# Patient Record
Sex: Male | Born: 1935 | Race: White | Hispanic: No | Smoking: Former smoker
Health system: Southern US, Community
[De-identification: ages and names within clinical notes are randomized; demographics above are authoritative.]

## PROBLEM LIST (undated history)

## (undated) DIAGNOSIS — J449 Chronic obstructive pulmonary disease, unspecified: Secondary | ICD-10-CM

## (undated) DIAGNOSIS — N400 Enlarged prostate without lower urinary tract symptoms: Secondary | ICD-10-CM

## (undated) HISTORY — PX: NO PAST SURGERIES: SHX2092

---

## 2013-08-29 ENCOUNTER — Encounter (HOSPITAL_COMMUNITY): Payer: Self-pay | Admitting: Emergency Medicine

## 2013-08-29 ENCOUNTER — Emergency Department (HOSPITAL_COMMUNITY)
Admission: EM | Admit: 2013-08-29 | Discharge: 2013-08-29 | Disposition: A | Discharge status: Home / Self Care | Payer: Medicare Other | Attending: Emergency Medicine | Admitting: Emergency Medicine

## 2013-08-29 DIAGNOSIS — I776 Arteritis, unspecified: Secondary | ICD-10-CM | POA: Insufficient documentation

## 2013-08-29 DIAGNOSIS — J4489 Other specified chronic obstructive pulmonary disease: Secondary | ICD-10-CM | POA: Insufficient documentation

## 2013-08-29 DIAGNOSIS — Z87891 Personal history of nicotine dependence: Secondary | ICD-10-CM | POA: Insufficient documentation

## 2013-08-29 DIAGNOSIS — Z79899 Other long term (current) drug therapy: Secondary | ICD-10-CM | POA: Insufficient documentation

## 2013-08-29 DIAGNOSIS — J449 Chronic obstructive pulmonary disease, unspecified: Secondary | ICD-10-CM | POA: Insufficient documentation

## 2013-08-29 DIAGNOSIS — N400 Enlarged prostate without lower urinary tract symptoms: Secondary | ICD-10-CM | POA: Insufficient documentation

## 2013-08-29 DIAGNOSIS — R21 Rash and other nonspecific skin eruption: Secondary | ICD-10-CM | POA: Insufficient documentation

## 2013-08-29 HISTORY — DX: Benign prostatic hyperplasia without lower urinary tract symptoms: N40.0

## 2013-08-29 HISTORY — DX: Chronic obstructive pulmonary disease, unspecified: J44.9

## 2013-08-29 LAB — COMPREHENSIVE METABOLIC PANEL
ALK PHOS: 79 U/L (ref 39–117)
ALT: 24 U/L (ref 0–53)
AST: 25 U/L (ref 0–37)
Albumin: 3.5 g/dL (ref 3.5–5.2)
BILIRUBIN TOTAL: 0.4 mg/dL (ref 0.3–1.2)
BUN: 19 mg/dL (ref 6–23)
CHLORIDE: 100 meq/L (ref 96–112)
CO2: 26 mEq/L (ref 19–32)
Calcium: 9.2 mg/dL (ref 8.4–10.5)
Creatinine, Ser: 0.91 mg/dL (ref 0.50–1.35)
GFR calc non Af Amer: 80 mL/min — ABNORMAL LOW (ref >90)
GLUCOSE: 95 mg/dL (ref 70–99)
POTASSIUM: 4.3 meq/L (ref 3.7–5.3)
Sodium: 140 mEq/L (ref 137–147)
TOTAL PROTEIN: 6.3 g/dL (ref 6.0–8.3)

## 2013-08-29 LAB — CBC WITH DIFFERENTIAL/PLATELET
Basophils Absolute: 0.1 10*3/uL (ref 0.0–0.1)
Basophils Relative: 1 % (ref 0–1)
EOS ABS: 0.4 10*3/uL (ref 0.0–0.7)
Eosinophils Relative: 6 % — ABNORMAL HIGH (ref 0–5)
HEMATOCRIT: 38.6 % — AB (ref 39.0–52.0)
HEMOGLOBIN: 13.1 g/dL (ref 13.0–17.0)
Lymphocytes Relative: 23 % (ref 12–46)
Lymphs Abs: 1.6 10*3/uL (ref 0.7–4.0)
MCH: 31 pg (ref 26.0–34.0)
MCHC: 33.9 g/dL (ref 30.0–36.0)
MCV: 91.5 fL (ref 78.0–100.0)
MONO ABS: 0.5 10*3/uL (ref 0.1–1.0)
MONOS PCT: 7 % (ref 3–12)
Neutro Abs: 4.5 10*3/uL (ref 1.7–7.7)
Neutrophils Relative %: 63 % (ref 43–77)
Platelets: 182 10*3/uL (ref 150–400)
RBC: 4.22 MIL/uL (ref 4.22–5.81)
RDW: 13.3 % (ref 11.5–15.5)
WBC: 7 10*3/uL (ref 4.0–10.5)

## 2013-08-29 MED ORDER — HYDROXYZINE HCL 25 MG PO TABS: 25.0000 mg | ORAL_TABLET | Freq: Four times a day (QID) | ORAL | Status: DC | PRN

## 2013-08-29 MED ORDER — PREDNISONE 10 MG PO TABS: 20.0000 mg | ORAL_TABLET | Freq: Every day | ORAL | Status: DC

## 2013-08-29 NOTE — ED Provider Notes (Signed)
CSN: [id number]     Arrival date & time 08/29/13  [id number] History   First MD Initiated Contact with Patient 08/29/13 [phone]     Chief Complaint  Patient presents with  . Leg Swelling  . Rash     (Consider location/radiation/quality/duration/timing/severity/associated sxs/prior Treatment) Patient is a 80 y.o. male presenting with rash. The history is provided by the patient (the pt complains of a rash to his arms and legs.  he aslo has swelling to his left leg which he has seen cardiology about).  Rash Location: arms and legs. Quality: burning   Severity:  Moderate Onset quality:  Gradual Timing:  Constant Chronicity:  New Context: not animal contact   Relieved by:  Nothing Associated symptoms: no abdominal pain, no diarrhea, no fatigue and no headaches     Past Medical History  Diagnosis Date  . COPD (chronic obstructive pulmonary disease)   . Enlarged prostate    History reviewed. No pertinent past surgical history. No family history on file. History  Substance Use Topics  . Smoking status: Former Games developermoker  . Smokeless tobacco: Not on file  . Alcohol Use: Yes     Comment: 1 drink once a day    Review of Systems  Constitutional: Negative for appetite change and fatigue.  HENT: Negative for congestion, ear discharge and sinus pressure.   Eyes: Negative for discharge.  Respiratory: Negative for cough.   Cardiovascular: Negative for chest pain.  Gastrointestinal: Negative for abdominal pain and diarrhea.  Genitourinary: Negative for frequency and hematuria.  Musculoskeletal: Negative for back pain.  Skin: Positive for rash.  Neurological: Negative for seizures and headaches.  Psychiatric/Behavioral: Negative for hallucinations.      Allergies  Review of patient's allergies indicates no known allergies.  Home Medications   Prior to Admission medications   Medication Sig Start Date End Date Taking? Authorizing Provider  albuterol (PROVENTIL HFA;VENTOLIN HFA) 108 (90  BASE) MCG/ACT inhaler Inhale 2 puffs into the lungs every 6 (six) hours as needed for wheezing or shortness of breath.   Yes Historical Provider, MD  albuterol (PROVENTIL) (2.5 MG/3ML) 0.083% nebulizer solution Take 2.5 mg by nebulization 2 (two) times daily.   Yes Historical Provider, MD  finasteride (PROSCAR) 5 MG tablet Take 5 mg by mouth daily.   Yes Historical Provider, MD  Fluticasone-Salmeterol (ADVAIR) 250-50 MCG/DOSE AEPB Inhale 1 puff into the lungs daily.   Yes Historical Provider, MD  furosemide (LASIX) 40 MG tablet Take 40 mg by mouth daily.   Yes Historical Provider, MD  hydrOXYzine (ATARAX/VISTARIL) 25 MG tablet Take 1 tablet (25 mg total) by mouth every 6 (six) hours as needed for itching. 08/29/13   Benny LennertJoseph L Madia Carvell, MD  predniSONE (DELTASONE) 10 MG tablet Take 2 tablets (20 mg total) by mouth daily. 08/29/13   Benny LennertJoseph L Verita Kuroda, MD   BP 161/74  Pulse 81  Temp(Src) 98.4 F (36.9 C) (Oral)  Resp 18  SpO2 96% Physical Exam  Constitutional: He is oriented to person, place, and time. He appears well-developed.  HENT:  Head: Normocephalic.  Eyes: Conjunctivae and EOM are normal. No scleral icterus.  Neck: Neck supple. No thyromegaly present.  Cardiovascular: Normal rate and regular rhythm.  Exam reveals no gallop and no friction rub.   No murmur heard. Pulmonary/Chest: No stridor. He has no wheezes. He has no rales. He exhibits no tenderness.  Abdominal: He exhibits no distension. There is no tenderness. There is no rebound.  Musculoskeletal: Normal range of motion. He exhibits  edema.  Edema left lower leg.  2 plus  Lymphadenopathy:    He has no cervical adenopathy.  Neurological: He is oriented to person, place, and time. He exhibits normal muscle tone. Coordination normal.  Skin: Rash noted. No erythema.  Rash to both arms and legs.  Confluent.  Possibly allergic or vasculitis  Psychiatric: He has a normal mood and affect. His behavior is normal.    ED Course  Procedures  (including critical care time) Labs Review Labs Reviewed  CBC WITH DIFFERENTIAL - Abnormal; Notable for the following:    HCT 38.6 (*)    Eosinophils Relative 6 (*)    All other components within normal limits  COMPREHENSIVE METABOLIC PANEL - Abnormal; Notable for the following:    GFR calc non Af Amer 80 (*)    All other components within normal limits    Imaging Review No results found.   EKG Interpretation None      MDM   Final diagnoses:  Vasculitis    Will tx with prednisone and close follow up with pcp    Benny LennertJoseph L Elna Radovich, MD 08/29/13 1123

## 2013-08-29 NOTE — ED Notes (Signed)
Pt c/o bilateral lower leg swelling, told him it was d/t fluid retention, pt recently up his lasix dosage to 40 mg. Went to cardiologist and had a full work up with a heart echo and ekg, was told everything was fine. Pt also c/o rash to bilateral upper extremities, starting around elbows, reports it is very itchy. Tried some camomile lotion at home but didn't get much relief. Denies increased sob. Hx of copd. Nad, skin warm and dry, resp e/u.

## 2013-08-29 NOTE — Discharge Instructions (Signed)
Follow up with your family md in 2 days for recheck.  He may want to send you to a dermatologist

## 2013-08-29 NOTE — ED Notes (Signed)
Pt comfortable with discharge and follow up instructions. Prescriptions x2. 

## 2013-08-31 ENCOUNTER — Ambulatory Visit (INDEPENDENT_AMBULATORY_CARE_PROVIDER_SITE_OTHER)
Admission: RE | Admit: 2013-08-31 | Discharge: 2013-08-31 | Disposition: A | Discharge status: Home / Self Care | Payer: Medicare Other | Source: Ambulatory Visit | Attending: Pulmonary Disease | Admitting: Pulmonary Disease

## 2013-08-31 ENCOUNTER — Ambulatory Visit (INDEPENDENT_AMBULATORY_CARE_PROVIDER_SITE_OTHER): Payer: Medicare Other | Admitting: Pulmonary Disease

## 2013-08-31 ENCOUNTER — Encounter: Payer: Self-pay | Admitting: Pulmonary Disease

## 2013-08-31 VITALS — BP 132/64 | HR 74 | Temp 98.0°F | Ht 69.0 in | Wt 188.6 lb

## 2013-08-31 DIAGNOSIS — R0609 Other forms of dyspnea: Secondary | ICD-10-CM

## 2013-08-31 DIAGNOSIS — R0989 Other specified symptoms and signs involving the circulatory and respiratory systems: Secondary | ICD-10-CM

## 2013-08-31 DIAGNOSIS — J439 Emphysema, unspecified: Secondary | ICD-10-CM | POA: Insufficient documentation

## 2013-08-31 HISTORY — DX: Emphysema, unspecified: J43.9

## 2013-08-31 NOTE — Patient Instructions (Signed)
Stay on advair everyday Stay off cigarettes Will check a chest xray today, and call you with results. Will schedule for breathing tests in the next few weeks, and will see you back the same day for review.

## 2013-08-31 NOTE — Assessment & Plan Note (Signed)
The patient has significant dyspnea on exertion, but it is clearly better since he has stopped smoking. He also has very little cough, mucus, or congestion since he has stopped smoking. He tells me that he has a history of COPD from the distant past, but I wonder if this was simply related to airway inflammation from his smoking. He will need to have repeat pulmonary function studies to establish a diagnosis.  Will leave him on the Advair for now, but can make changes in his medications based on the results of his breathing studies. Will also check a chest x-ray as well. Regarding his lower extremity edema, I suspect that it is not related to anything from a pulmonary standpoint, but will complete his pulmonary workup first.

## 2013-08-31 NOTE — Progress Notes (Signed)
   Subjective:    Patient ID: Craig Rodriguez, male    DOB: 03/20/1934, 80 y.o.   MRN: [id number]  HPI The patient is a 80 year old male who I've been asked to see for COPD. The patient tells me that he was diagnosed many years ago, but he was a very heavy smoker at that time. He has not smoked since January of this year, and has seen a significant improvement in his pulmonary symptoms. The patient feels that he was stable until the first of the year when he had what sounds like a COPD exacerbation that required hospitalization. He has not smoked since that time. He currently denies cough or congestion, and states this has greatly improved since smoking cessation. He describes a one block dyspnea on exertion at a moderate pace, but does not get winded with a flight of stairs or bringing in groceries. He tells me that he could not do any of this prior to January when he was smoking. He currently is on Advair, and as well as prednisone for a new rash that has developed. He also has new swelling in his left lower extremity that is being evaluated. He tells me that he had negative venous Dopplers, and has also had a recent echocardiogram.   Review of Systems  Constitutional: Positive for unexpected weight change. Negative for fever.  HENT: Negative for congestion, dental problem, ear pain, nosebleeds, postnasal drip, rhinorrhea, sinus pressure, sneezing, sore throat and trouble swallowing.   Eyes: Negative for redness and itching.  Respiratory: Positive for cough and shortness of breath. Negative for chest tightness and wheezing.   Cardiovascular: Negative for palpitations and leg swelling.  Gastrointestinal: Negative for nausea and vomiting.  Genitourinary: Negative for dysuria.  Musculoskeletal: Positive for joint swelling.  Skin: Negative for rash ( itching).  Neurological: Negative for headaches.  Hematological: Does not bruise/bleed easily.  Psychiatric/Behavioral: Negative for dysphoric mood. The  patient is not nervous/anxious.        Objective:   Physical Exam Constitutional:  Well developed, no acute distress  HENT:  Nares patent without discharge  Oropharynx without exudate, palate and uvula are normal  Eyes:  Perrla, eomi, no scleral icterus  Neck:  No JVD, no TMG  Cardiovascular:  Normal rate, regular rhythm, no rubs or gallops.  No murmurs        Intact distal pulses but diminished.  Pulmonary :  Mildly decreased breath sounds, no stridor or respiratory distress   No rales, rhonchi, or wheezing  Abdominal:  Soft, nondistended, bowel sounds present.  No tenderness noted.   Musculoskeletal:  1+ lower extremity edema noted on LLE, mild on RLE (which is atrophied)  Lymph Nodes:  No cervical lymphadenopathy noted  Skin:  No cyanosis noted  Neurologic:  Alert, appropriate, moves all 4 extremities without obvious deficit.         Assessment & Plan:

## 2013-09-04 ENCOUNTER — Telehealth: Payer: Self-pay | Admitting: Pulmonary Disease

## 2013-09-04 ENCOUNTER — Other Ambulatory Visit: Payer: Self-pay | Admitting: Pulmonary Disease

## 2013-09-04 DIAGNOSIS — R9389 Abnormal findings on diagnostic imaging of other specified body structures: Secondary | ICD-10-CM

## 2013-09-04 HISTORY — DX: Abnormal findings on diagnostic imaging of other specified body structures: R93.89

## 2013-09-04 NOTE — Telephone Encounter (Signed)
Notes Recorded by Maisie FusAshtyn M Green, CMA on 09/04/2013 at 11:53 AM Results have been explained to patient, pt expressed understanding. Pt wishes to move forward with CT scan. Will need to schedule CT around his transportation. ------  Notes Recorded by Barbaraann ShareKeith M Clance, MD on 08/31/2013 at 5:39 PM Please let pt know that his cxr shows that part of his right lung is not getting air. This may be from mucus plugging the breathing passage, but can't exclude a growth. Needs ct chest to evaluate further. If he is ok with this, will send an order. ---  Called spoke with daughter made aware of results. CT is already scheduled. Nothing further needed

## 2013-09-05 ENCOUNTER — Institutional Professional Consult (permissible substitution): Payer: Medicare Other | Admitting: Internal Medicine

## 2013-09-10 ENCOUNTER — Other Ambulatory Visit: Payer: Medicare Other

## 2013-09-11 ENCOUNTER — Ambulatory Visit (INDEPENDENT_AMBULATORY_CARE_PROVIDER_SITE_OTHER)
Admission: RE | Admit: 2013-09-11 | Discharge: 2013-09-11 | Disposition: A | Discharge status: Home / Self Care | Payer: Medicare Other | Source: Ambulatory Visit | Attending: Pulmonary Disease | Admitting: Pulmonary Disease

## 2013-09-11 ENCOUNTER — Encounter: Payer: Self-pay | Admitting: Pulmonary Disease

## 2013-09-11 ENCOUNTER — Ambulatory Visit (HOSPITAL_COMMUNITY)
Admission: RE | Admit: 2013-09-11 | Discharge: 2013-09-11 | Disposition: A | Discharge status: Home / Self Care | Payer: Medicare Other | Source: Ambulatory Visit | Attending: Pulmonary Disease | Admitting: Pulmonary Disease

## 2013-09-11 ENCOUNTER — Ambulatory Visit (INDEPENDENT_AMBULATORY_CARE_PROVIDER_SITE_OTHER): Payer: Medicare Other | Admitting: Pulmonary Disease

## 2013-09-11 VITALS — BP 130/72 | HR 92 | Temp 98.4°F | Ht 69.0 in | Wt 183.6 lb

## 2013-09-11 DIAGNOSIS — R9389 Abnormal findings on diagnostic imaging of other specified body structures: Secondary | ICD-10-CM

## 2013-09-11 DIAGNOSIS — J439 Emphysema, unspecified: Secondary | ICD-10-CM

## 2013-09-11 DIAGNOSIS — R0989 Other specified symptoms and signs involving the circulatory and respiratory systems: Principal | ICD-10-CM | POA: Insufficient documentation

## 2013-09-11 DIAGNOSIS — R0609 Other forms of dyspnea: Secondary | ICD-10-CM | POA: Insufficient documentation

## 2013-09-11 DIAGNOSIS — J438 Other emphysema: Secondary | ICD-10-CM

## 2013-09-11 DIAGNOSIS — R918 Other nonspecific abnormal finding of lung field: Secondary | ICD-10-CM

## 2013-09-11 MED ORDER — ALBUTEROL SULFATE (2.5 MG/3ML) 0.083% IN NEBU
2.5000 mg | INHALATION_SOLUTION | Freq: Once | RESPIRATORY_TRACT | Status: AC
  Administered 2013-09-11: 2.5 mg via RESPIRATORY_TRACT

## 2013-09-11 NOTE — Patient Instructions (Signed)
Stay on advair, but let me know if you want to try adding spiriva or a medication that is different but in same family Stay as active as possible.  Let me know if you change your mind about referral to pulmonary [facility] Will check a chest xray at [facility] in one month, and will call you with results.  Let me know if you change your mind and want to look at the abnormal area within your lungs with a scope. followup with me in 6mos

## 2013-09-11 NOTE — Assessment & Plan Note (Addendum)
The patient has moderate to severe airflow obstruction by his PFTs, and most consistent with emphysema. He is on an LABA/ICS only, and has quit smoking. I have discussed adding an anticholinergic bronchodilator, however the patient does not want to do this because of his underlying prostate issues. I have also counseled him on aggressive weight loss and conditioning, and offered to refer to the pulmonary [facility]. He has decided against this as well. I have encouraged him to try and stay as active as possible, and to work on weight loss.  I have spent over 30 minutes discussing his abnormal CT finding, as well as his PFTs. I have counseled him on both of these at great length.

## 2013-09-11 NOTE — Progress Notes (Signed)
   Subjective:    Patient ID: Craig Rodriguez, male    DOB: 12/13/1933, 80 y.o.   MRN: [id number]  HPI The patient comes in today for followup of his recent PFTs and CT chest. His PFTs showed moderate to severe airflow obstruction, with a 22% improvement in his FEV1. He had air trapping on lung volumes, and a moderate to severely reduced DLCO. His chest x-ray at the last visit showed right middle lobe atelectasis, and a CT chest showed near complete collapse of his right middle lobe. There appeared to be significant narrowing of his right middle lobe bronchus, but it was unclear whether this was inflammatory or possibly neoplastic. I have reviewed all of this with the patient.   Review of Systems  Constitutional: Negative for fever and unexpected weight change.  HENT: Negative for congestion, dental problem, ear pain, nosebleeds, postnasal drip, rhinorrhea, sinus pressure, sneezing, sore throat and trouble swallowing.   Eyes: Negative for redness and itching.  Respiratory: Negative for cough, chest tightness, shortness of breath and wheezing.   Cardiovascular: Positive for leg swelling. Negative for palpitations.  Gastrointestinal: Negative for nausea and vomiting.  Genitourinary: Negative for dysuria.  Musculoskeletal: Negative for joint swelling.  Skin: Negative for rash.  Neurological: Negative for headaches.  Hematological: Does not bruise/bleed easily.  Psychiatric/Behavioral: Negative for dysphoric mood. The patient is not nervous/anxious.        Objective:   Physical Exam Overweight male in no acute distress Nose without purulence or discharge noted Neck without lymphadenopathy or thyromegaly Chest with decreased breath sounds, no wheezing Cardiac exam with regular rate and rhythm Lower extremities with mild ankle edema Alert and oriented, moves all 4 extremities.        Assessment & Plan:

## 2013-09-11 NOTE — Assessment & Plan Note (Signed)
The patient has near-complete collapse of the right middle lobe, and a CT chest shows an area of constriction of the right middle lobe bronchus.  It is unclear if this is inflammatory in nature, mucus plugging, or whether there is an underlying malignancy. I have recommended fiberoptic bronchoscopy for an airway exam to the patient, however, he would like to avoid this and do a followup x-ray in one month. I have recommended against this, but the patient feels this is best for him. He understands that he may have a malignancy in his chest that can worsen over time. We'll do a followup chest x-ray in one month and call him with the results

## 2013-09-17 LAB — PULMONARY FUNCTION TEST
DL/VA % pred: 63 %
DL/VA: 2.86 ml/min/mmHg/L
DLCO UNC: 16.25 ml/min/mmHg
DLCO cor % pred: 54 %
DLCO cor: 17.01 ml/min/mmHg
DLCO unc % pred: 52 %
FEF 25-75 Post: 0.84 L/sec
FEF 25-75 Pre: 0.57 L/sec
FEF2575-%Change-Post: 48 %
FEF2575-%Pred-Post: 42 %
FEF2575-%Pred-Pre: 28 %
FEV1-%Change-Post: 22 %
FEV1-%Pred-Post: 56 %
FEV1-%Pred-Pre: 45 %
FEV1-POST: 1.59 L
FEV1-Pre: 1.3 L
FEV1FVC-%CHANGE-POST: 9 %
FEV1FVC-%Pred-Pre: 69 %
FEV6-%Change-Post: 12 %
FEV6-%PRED-PRE: 67 %
FEV6-%Pred-Post: 76 %
FEV6-PRE: 2.5 L
FEV6-Post: 2.82 L
FEV6FVC-%Change-Post: 0 %
FEV6FVC-%PRED-PRE: 103 %
FEV6FVC-%Pred-Post: 104 %
FVC-%Change-Post: 11 %
FVC-%PRED-PRE: 65 %
FVC-%Pred-Post: 73 %
FVC-Post: 2.91 L
FVC-Pre: 2.6 L
POST FEV6/FVC RATIO: 97 %
Post FEV1/FVC ratio: 55 %
Pre FEV1/FVC ratio: 50 %
Pre FEV6/FVC Ratio: 96 %
RV % PRED: 177 %
RV: 4.53 L
TLC % PRED: 108 %
TLC: 7.46 L

## 2013-10-02 ENCOUNTER — Institutional Professional Consult (permissible substitution): Payer: Medicare Other | Admitting: Critical Care Medicine

## 2013-10-11 ENCOUNTER — Telehealth: Payer: Self-pay | Admitting: Pulmonary Disease

## 2013-10-11 DIAGNOSIS — J439 Emphysema, unspecified: Secondary | ICD-10-CM

## 2013-10-11 NOTE — Telephone Encounter (Signed)
Spoke with Daughter ([name])--requesting results of cxr from [facility]. Aware that once reviewed by Dr Shelle Iron, we will call with results.   Spoke with Radiology Dept at [facility]--faxing results to triage.   Results have ben received and placed in [facility] Craig Rodriguez folder to review in the AM.  Please advise Dr Shelle Iron. Thanks.

## 2013-10-12 NOTE — Telephone Encounter (Signed)
Called and spoke Aurther Loft and stated pt can be set up with pulmonary rehab, order has been placed, and someone will be contacting her to schedule it. Aurther Loft is also aware that the procedure cannot be preformed unless pt himself has consented to it. Aurther Loft stated she would call back next week after she talks to father about procedure. Nothing further needed at this time.

## 2013-10-12 NOTE — Telephone Encounter (Signed)
Please let pt know that cxr is unchanged.  Still has partial collapse of right middle lobe that could be a mucus plug or a cancer.  My recommendation still stands.  He needs bronchoscopy, as I discussed at last visit.  Let us know if he changes his mind about having the procedure.

## 2013-10-12 NOTE — Telephone Encounter (Signed)
Ok to send order to pcc for pulmonary rehab at [facility] until pt himself agrees to the procedure.  It usually can be done fairly quickly

## 2013-10-12 NOTE — Telephone Encounter (Signed)
Spoke with [name](daughter),  Daughter states that she will talk to her father today about having the procedure and will call back either today or next week with a decision.  Will await call.

## 2013-10-12 NOTE — Telephone Encounter (Signed)
Patient's daughter calling back.  [phone]

## 2013-10-12 NOTE — Telephone Encounter (Signed)
Spoke with the daughter, Craig Rodriguez  She states that the pt has not agreed to bronch yet, but "I have made the decision for him" and she wants to have this set up  She states that it will be another 3 wks before it can be done due to her work schedule, and in the meantime pt wants to be set up for exercise program in [place], please advise thanks!

## 2013-10-16 ENCOUNTER — Telehealth: Payer: Self-pay | Admitting: Pulmonary Disease

## 2013-10-16 NOTE — Telephone Encounter (Signed)
Spoke with Craig Rodriguez and advised that the [employer] staff will contact him with appt for rehab  We just faxed them the order yesterday afternoon and this takes a few days  She verbalized understanding  She states to let [facility] know that the pt has agreed to have the bronch  Will forward to [facility]

## 2013-10-17 NOTE — Telephone Encounter (Signed)
Craig Rodriguez, this pt needs bronch sometime next week.  No fluoro, small scope, no tb risk Need at [facility] either tue or wed am at 730.  Let me know what works out best for pt.  Let them know about npo and someone needs to bring and stay with him all day.  Thanks

## 2013-10-18 NOTE — Telephone Encounter (Signed)
I called spoke with Aurther Loft. She is aware of appt date, time, location. Aware he will need someone to be with him all day. Nothing further needed

## 2013-10-18 NOTE — Telephone Encounter (Signed)
Ms. Craig Rodriguez returned call. Please call back at [phone]

## 2013-10-18 NOTE — Telephone Encounter (Signed)
Bronch set for Tuesday June 9th at 7:30am. LMTCBx1 with terry, pt daughter, to advise of date, time, and directions. Carron Curie, CMA

## 2013-10-22 ENCOUNTER — Telehealth: Payer: Self-pay | Admitting: Pulmonary Disease

## 2013-10-22 NOTE — Telephone Encounter (Signed)
LMOm x 1 

## 2013-10-22 NOTE — Telephone Encounter (Signed)
Spoke with Daughter - Craig Rodriguez Needing to cancel [facility] Bronch appt 10/23/13 Requests this be rescheduled to the week of June 22nd Appt cancelled.   Please advise Dr Shelle Iron when you are available to do Bronch the week of June 22nd. Thanks.

## 2013-10-22 NOTE — Telephone Encounter (Signed)
Called spoke with Geneva over at [facility]. She reports we can do 11/06/13 at 7:30. I called Ulice Bold made ehr aware of appt, date, time, location.  Will also forward to [facility] as an [place]

## 2013-10-22 NOTE — Telephone Encounter (Signed)
Can do 6/23 or 6/24 at 730 am.  No fluoro needed.

## 2013-10-23 ENCOUNTER — Encounter (HOSPITAL_COMMUNITY): Admission: RE | Payer: Self-pay | Source: Ambulatory Visit

## 2013-10-23 ENCOUNTER — Ambulatory Visit (HOSPITAL_COMMUNITY): Admission: RE | Admit: 2013-10-23 | Payer: Medicare Other | Source: Ambulatory Visit | Admitting: Pulmonary Disease

## 2013-10-23 ENCOUNTER — Encounter (HOSPITAL_COMMUNITY): Payer: Medicare Other

## 2013-10-23 SURGERY — VIDEO BRONCHOSCOPY WITHOUT FLUORO
Anesthesia: Moderate Sedation | Laterality: Bilateral

## 2013-11-06 ENCOUNTER — Ambulatory Visit (HOSPITAL_COMMUNITY)
Admission: RE | Admit: 2013-11-06 | Discharge: 2013-11-06 | Disposition: A | Discharge status: Home / Self Care | Payer: Medicare Other | Source: Ambulatory Visit | Attending: Pulmonary Disease | Admitting: Pulmonary Disease

## 2013-11-06 ENCOUNTER — Encounter (HOSPITAL_COMMUNITY)
Admission: RE | Disposition: A | Discharge status: Home / Self Care | Payer: Medicare Other | Source: Ambulatory Visit | Attending: Pulmonary Disease

## 2013-11-06 ENCOUNTER — Ambulatory Visit (HOSPITAL_COMMUNITY): Payer: Medicare Other

## 2013-11-06 ENCOUNTER — Encounter (HOSPITAL_COMMUNITY): Payer: Self-pay | Admitting: Respiratory Therapy

## 2013-11-06 DIAGNOSIS — Z87891 Personal history of nicotine dependence: Secondary | ICD-10-CM | POA: Insufficient documentation

## 2013-11-06 DIAGNOSIS — E663 Overweight: Secondary | ICD-10-CM | POA: Insufficient documentation

## 2013-11-06 DIAGNOSIS — J438 Other emphysema: Secondary | ICD-10-CM | POA: Insufficient documentation

## 2013-11-06 DIAGNOSIS — R9389 Abnormal findings on diagnostic imaging of other specified body structures: Secondary | ICD-10-CM

## 2013-11-06 DIAGNOSIS — J9819 Other pulmonary collapse: Secondary | ICD-10-CM | POA: Insufficient documentation

## 2013-11-06 HISTORY — PX: VIDEO BRONCHOSCOPY: SHX5072

## 2013-11-06 SURGERY — VIDEO BRONCHOSCOPY WITHOUT FLUORO
Anesthesia: Choice | Laterality: Bilateral

## 2013-11-06 MED ORDER — LIDOCAINE HCL 2 % EX GEL
CUTANEOUS | Status: DC | PRN
  Administered 2013-11-06: 1

## 2013-11-06 MED ORDER — MEPERIDINE HCL 100 MG/ML IJ SOLN
INTRAMUSCULAR | Status: AC
  Filled 2013-11-06: qty 2

## 2013-11-06 MED ORDER — LIDOCAINE HCL 2 % EX GEL: Freq: Once | CUTANEOUS | Status: DC

## 2013-11-06 MED ORDER — PHENYLEPHRINE HCL 0.25 % NA SOLN
NASAL | Status: DC | PRN
  Administered 2013-11-06: 1 via NASAL

## 2013-11-06 MED ORDER — LIDOCAINE HCL 1 % IJ SOLN
INTRAMUSCULAR | Status: DC | PRN
  Administered 2013-11-06: 6 mL via RESPIRATORY_TRACT

## 2013-11-06 MED ORDER — PHENYLEPHRINE HCL 0.25 % NA SOLN: 1.0000 | Freq: Four times a day (QID) | NASAL | Status: DC | PRN

## 2013-11-06 MED ORDER — MIDAZOLAM HCL 10 MG/2ML IJ SOLN
INTRAMUSCULAR | Status: AC
  Filled 2013-11-06: qty 4

## 2013-11-06 MED ORDER — MIDAZOLAM HCL 10 MG/2ML IJ SOLN
INTRAMUSCULAR | Status: DC | PRN
  Administered 2013-11-06: 5 mg via INTRAVENOUS
  Administered 2013-11-06: 2.5 mg via INTRAVENOUS

## 2013-11-06 MED ORDER — MEPERIDINE HCL 25 MG/ML IJ SOLN
INTRAMUSCULAR | Status: DC | PRN
  Administered 2013-11-06: 50 mg via INTRAVENOUS

## 2013-11-06 MED ORDER — SODIUM CHLORIDE 0.9 % IV SOLN
INTRAVENOUS | Status: DC
  Administered 2013-11-06: 07:00:00 via INTRAVENOUS

## 2013-11-06 MED ORDER — BUTAMBEN-TETRACAINE-BENZOCAINE 2-2-14 % EX AERO: 1.0000 | INHALATION_SPRAY | Freq: Once | CUTANEOUS | Status: DC

## 2013-11-06 NOTE — Op Note (Signed)
Dictation #:  [id number]

## 2013-11-06 NOTE — H&P (Signed)
Subjective:       Patient ID: Craig Rodriguez Arai, male    DOB: 01/19/1934, [age] y.o.   MRN: [id number]   HPI The patient comes in today for followup of his recent PFTs and CT chest. His PFTs showed moderate to severe airflow obstruction, with a 22% improvement in his FEV1. He had air trapping on lung volumes, and a moderate to severely reduced DLCO. His chest x-ray at the last visit showed right middle lobe atelectasis, and a CT chest showed near complete collapse of his right middle lobe. There appeared to be significant narrowing of his right middle lobe bronchus, but it was unclear whether this was inflammatory or possibly neoplastic. I have reviewed all of this with the patient.     Review of Systems  Constitutional: Negative for fever and unexpected weight change.  HENT: Negative for congestion, dental problem, ear pain, nosebleeds, postnasal drip, rhinorrhea, sinus pressure, sneezing, sore throat and trouble swallowing.   Eyes: Negative for redness and itching.  Respiratory: Negative for cough, chest tightness, shortness of breath and wheezing.   Cardiovascular: Positive for leg swelling. Negative for palpitations.  Gastrointestinal: Negative for nausea and vomiting.  Genitourinary: Negative for dysuria.  Musculoskeletal: Negative for joint swelling.  Skin: Negative for rash.  Neurological: Negative for headaches.  Hematological: Does not bruise/bleed easily.  Psychiatric/Behavioral: Negative for dysphoric mood. The patient is not nervous/anxious.          Objective:     Physical Exam Overweight male in no acute distress Nose without purulence or discharge noted Neck without lymphadenopathy or thyromegaly Chest with decreased breath sounds, no wheezing Cardiac exam with regular rate and rhythm Lower extremities with mild ankle edema Alert and oriented, moves all 4 extremities.             Assessment & Plan:            Revision History       Date/Time User Action       > 09/11/2013  4:12 PM Barbaraann ShareKeith M Clance, MD Sign        09/11/2013  3:19 PM Darrell JewelJennifer R Castillo, CMA Sign at close encounter                  Abnormal CT of the chest - Barbaraann ShareKeith M Clance, MD at 09/11/2013  4:06 PM      Status: Written Related Problem: Abnormal CT of the chest    The patient has near-complete collapse of the right middle lobe, and a CT chest shows an area of constriction of the right middle lobe bronchus.  It is unclear if this is inflammatory in nature, mucus plugging, or whether there is an underlying malignancy. I have recommended fiberoptic bronchoscopy for an airway exam to the patient, however, he would like to avoid this and do a followup x-ray in one month. I have recommended against this, but the patient feels this is best for him. He understands that he may have a malignancy in his chest that can worsen over time. We'll do a followup chest x-ray in one month and call him with the results         COPD with emphysema - Barbaraann ShareKeith M Clance, MD at 09/11/2013  4:09 PM      Status: Linus OrnEdited Related Problem: COPD with emphysema    The patient has moderate to severe airflow obstruction by his PFTs, and most consistent with emphysema. He is on an LABA/ICS only, and has quit smoking. I have discussed  adding an anticholinergic bronchodilator, however the patient does not want to do this because of his underlying prostate issues. I have also counseled him on aggressive weight loss and conditioning, and offered to refer to the pulmonary rehabilitation program [facility]. He has decided against this as well. I have encouraged him to try and stay as active as possible, and to work on weight loss.  I have spent over 30 minutes discussing his abnormal CT finding, as well as his PFTs. I have counseled him on both of these at great length.

## 2013-11-06 NOTE — Discharge Instructions (Signed)
Flexible Bronchoscopy, Care After These instructions give you information on caring for yourself after your procedure. Your doctor may also give you more specific instructions. Call your doctor if you have any problems or questions after your procedure. HOME CARE  Do not eat or drink anything for 2 hours after your procedure. If you try to eat or drink before the medicine wears off, food or drink could go into your lungs. You could also burn yourself.  After 2 hours have passed and when you can cough and gag normally, you may eat soft food and drink liquids slowly.  The day after the test, you may eat your normal diet.  You may do your normal activities.  Keep all doctor visits. GET HELP RIGHT AWAY IF:  You get more and more short of breath.  You get light-headed.  You feel like you are going to pass out (faint).  You have chest pain.  You have new problems that worry you.  You cough up more than a little blood.  You cough up more blood than before. MAKE SURE YOU:  Understand these instructions.  Will watch your condition.  Will get help right away if you are not doing well or get worse. Document Released: 02/28/2009 Document Revised: 05/08/2013 Document Reviewed: 01/05/2013 [facility] Patient Information 2015 [employer] [place]. This information is not intended to replace advice given to you by your health care provider. Make sure you discuss any questions you have with your health care provider.  Nothing to eat or drink until  10:15 am  Today   11/06/2013

## 2013-11-06 NOTE — OR Nursing (Signed)
Patient transferred from cardiopulmonary with sats in low 90s.  Applied nasal canula and O2/2L with increase in O2 sats to 98%.  Will continue to monitor.

## 2013-11-06 NOTE — Op Note (Signed)
NAME:  Craig Rodriguez, Jaun                ACCOUNT NO.:  [id number]  MEDICAL RECORD NO.:  [id number]  LOCATION:  [facility]                         [facility]  PHYSICIAN:  Barbaraann ShareKeith M. Clance, MD,FCCPDATE OF BIRTH:  1933/11/23  DATE OF PROCEDURE:  11/06/2013 DATE OF DISCHARGE:                              OPERATIVE REPORT   PROCEDURE:  Flexible fiberoptic bronchoscopy.  OPERATOR:  Barbaraann ShareKeith M. Clance, MD, FCCP  INDICATION:  Right middle lobe collapse in a patient with a long history of smoking.  ANESTHESIA:  Versed 7.5 mg IV, Demerol 50 mg IV, and topical 1% lidocaine in the vocal cords and airways during the procedure.  DESCRIPTION OF PROCEDURE:  After obtaining informed consent and under close cardiopulmonary monitoring the above preop anesthesia was given and the fiberoptic scope was passed to the right naris and into posterior pharynx where there were no lesions or other abnormalities seen.  The vocal cords appeared to be within normal limits and moved bilaterally on phonation.  The scope was then passed into the trachea, where it was examined along its entire length down to the level of the choana all of which was normal.  The left tracheobronchial tree was examined serially to subsegmental level with no endobronchial abnormality being found.  The scope was then passed into the right tracheobronchial tree where the right upper lobe and right lower lobe appeared totally normal with no endobronchial abnormality.  The bronchus intermedius was widely patent with no intrinsic or extrinsic compression.  The focus was then turned to the right middle lobe where the orifice appeared minimally fishmouth but totally patent.  The endobronchial mucosa appeared normal, and there was no extrinsic compression.  The airway and right middle lobe segments appeared to be totally normal as far as I could see through the scope.  The bronchoalveolar lavage was then done from the medial and lateral segments of  the right middle lobe, and bronchial brushes were also done distally for completeness.  Overall, the patient tolerated the procedure quite well.  There were no complications.     Barbaraann ShareKeith M. Clance, MD,FCCP     [facility]  D:  11/06/2013  T:  11/06/2013  Job:  [id number]

## 2013-11-06 NOTE — Progress Notes (Signed)
Video Bronchoscopy Done  Intervention Bronchial washing done Intervention Bronchial brushing done  Procedure tolerated well

## 2013-11-06 NOTE — OR Nursing (Signed)
Per Dr. Shelle Ironlance, he viewed the chest x-ray and the patient is cleared for discharge.

## 2013-11-07 ENCOUNTER — Encounter (HOSPITAL_COMMUNITY): Payer: Self-pay | Admitting: Pulmonary Disease

## 2013-11-08 ENCOUNTER — Other Ambulatory Visit: Payer: Self-pay | Admitting: Pulmonary Disease

## 2013-11-08 MED ORDER — CEFDINIR 300 MG PO CAPS: ORAL_CAPSULE | ORAL | Status: DC

## 2013-11-09 ENCOUNTER — Telehealth: Payer: Self-pay | Admitting: Pulmonary Disease

## 2013-11-09 LAB — CULTURE, BAL-QUANTITATIVE W GRAM STAIN: Colony Count: >=100000

## 2013-11-09 LAB — CULTURE, BAL-QUANTITATIVE: SPECIAL REQUESTS: NORMAL

## 2013-11-09 NOTE — Telephone Encounter (Signed)
lmomtcb for [employer]

## 2013-11-09 NOTE — Telephone Encounter (Signed)
I called spoke with Aurther Lofterry. She reports on the hospital d/c paperwork, it states pt to f/u with [facility] on 6/30 at 10 AM w/ [facility]. No appt is in pt chart or cancelled appt. Aurther Lofterry wants to know when pt needs to f/u then? He has appt scheduled for 03/15/14 w/ [facility]. Please advise thanks

## 2013-11-09 NOTE — Telephone Encounter (Signed)
We did not make this apptm, nor did we recommend this. Would like to see  Him sometime middle of July to check on things.

## 2013-11-09 NOTE — Telephone Encounter (Signed)
ATC line busy x 4 wcb 

## 2013-11-12 NOTE — Telephone Encounter (Signed)
appt set. Jennifer Castillo, CMA  

## 2013-11-14 ENCOUNTER — Encounter: Payer: Self-pay | Admitting: Pulmonary Disease

## 2013-11-29 ENCOUNTER — Ambulatory Visit: Payer: Medicare Other | Admitting: Pulmonary Disease

## 2013-12-19 LAB — AFB CULTURE WITH SMEAR (NOT AT ARMC)
Acid Fast Smear: NONE SEEN
Special Requests: NORMAL

## 2014-03-15 ENCOUNTER — Ambulatory Visit: Payer: Medicare Other | Admitting: Pulmonary Disease

## 2014-12-22 IMAGING — CT CT CHEST W/O CM
2 of 4 series · 15 of 36 positions shown, 18 images · IV contrast (Omnipaque 300)
Comparison: Chest x-ray 08/31/2013.

CLINICAL DATA: Atelectasis noted on recent chest x-ray. Prior
history of smoking.

EXAM:
CT CHEST WITHOUT CONTRAST
TECHNIQUE: Multidetector CT imaging of the chest was performed following the
standard protocol without IV contrast.

[Series 2: chest routine with · axial · 0.71mm/px · z∈[-280,-30]mm · 12 of 60 slices shown, 15 images]
[im 5/60  mediastinal]
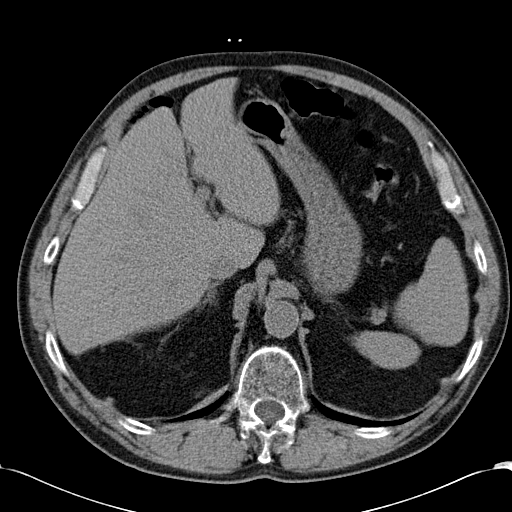
[im 5/60  lung]
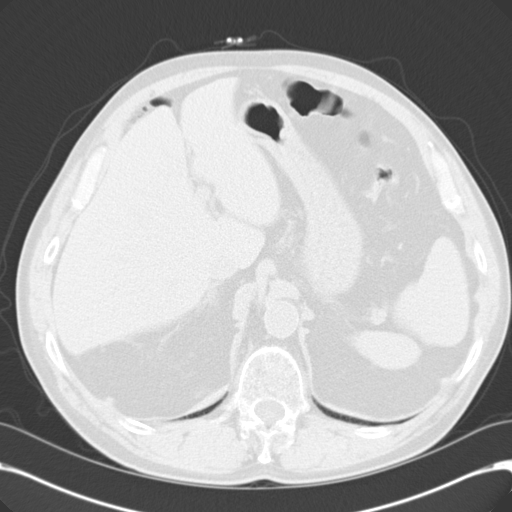
[im 10/60  lung]
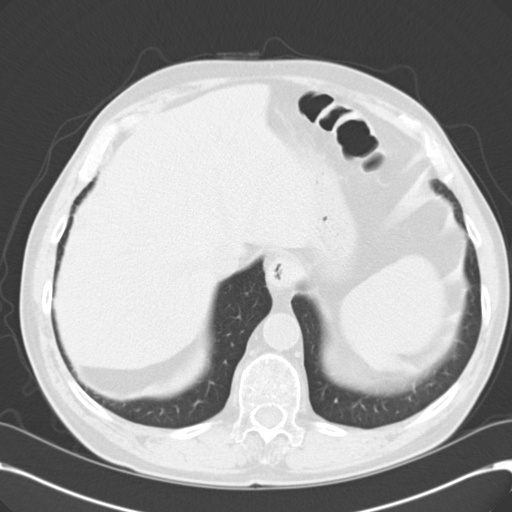
[im 14/60  lung]
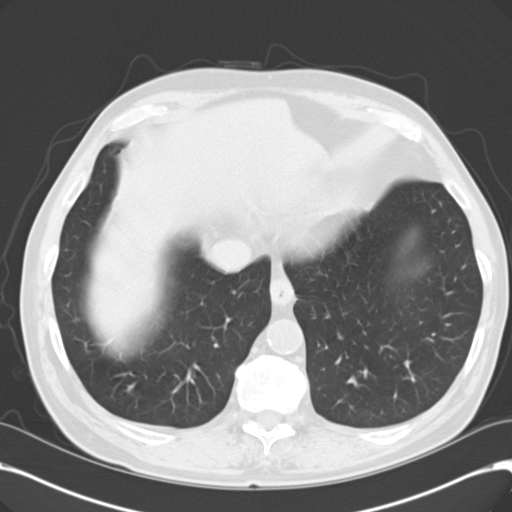
[im 19/60  lung]
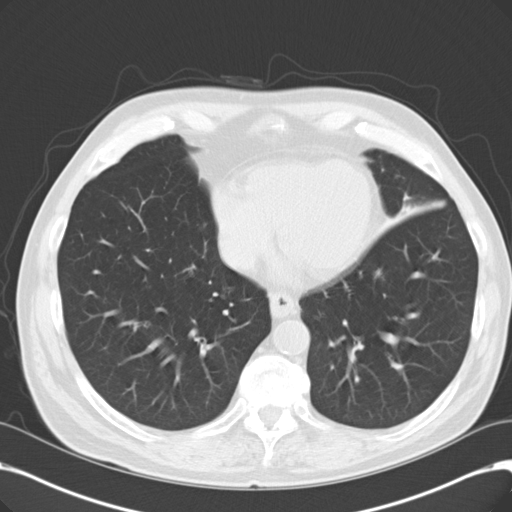
[im 23/60  mediastinal]
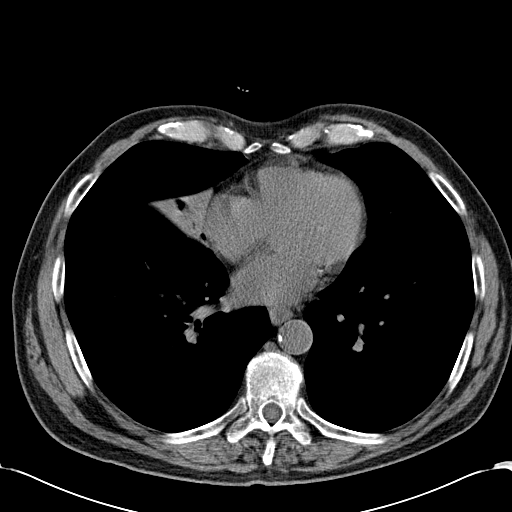
[im 23/60  lung]
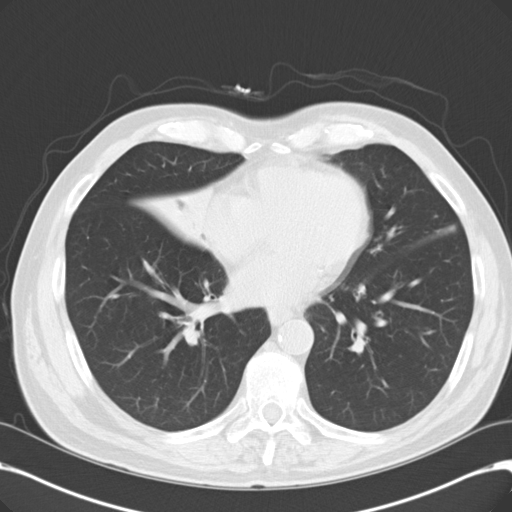
[im 28/60  lung]
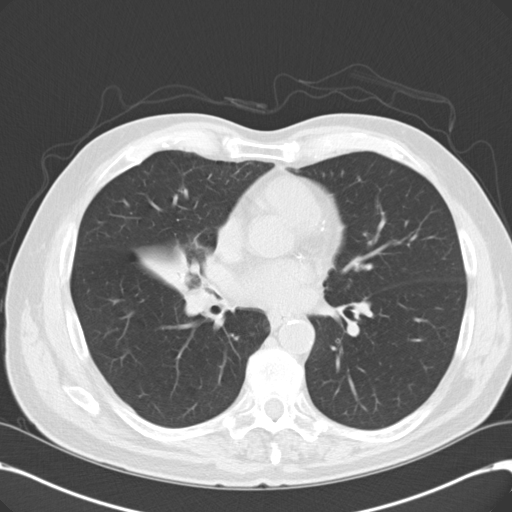
[im 32/60  lung]
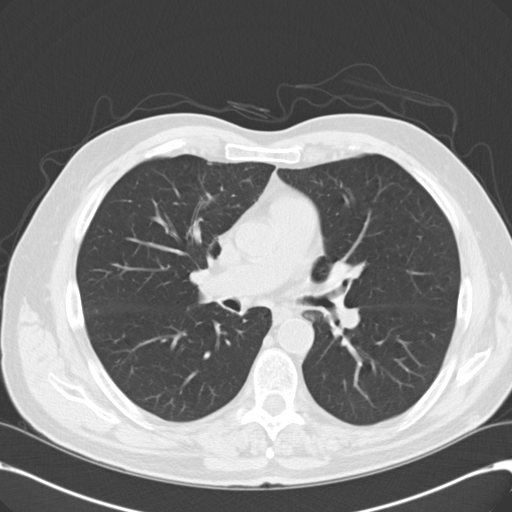
[im 37/60  lung]
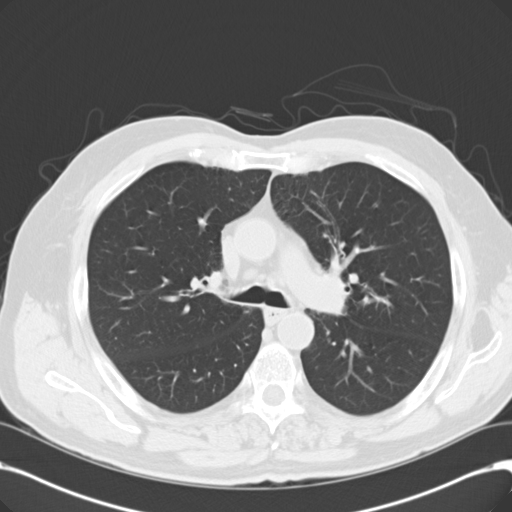
[im 41/60  mediastinal]
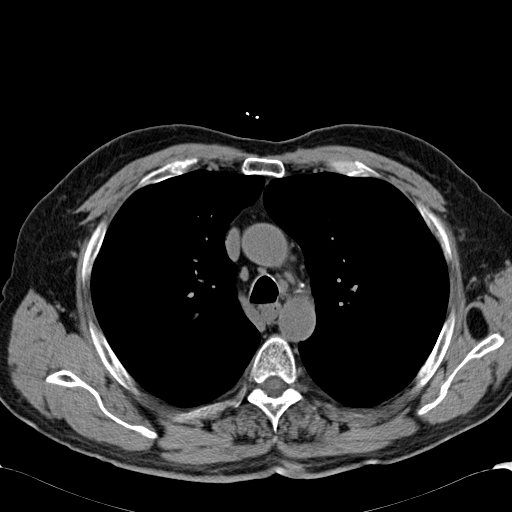
[im 41/60  lung]
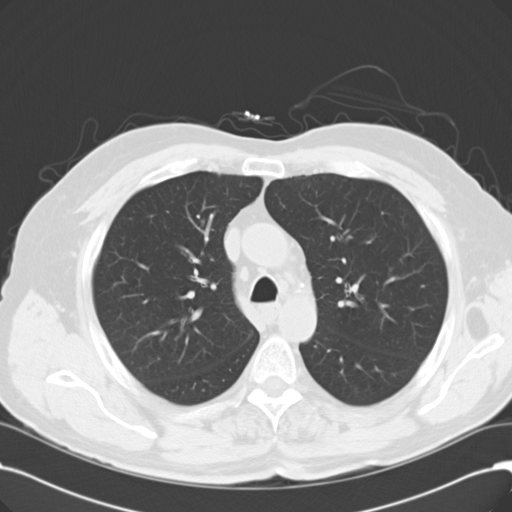
[im 46/60  lung]
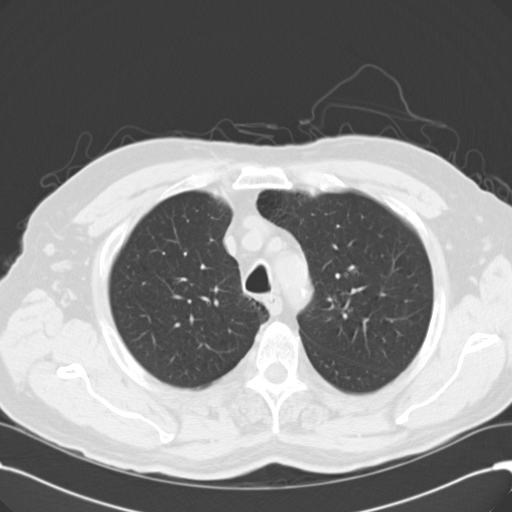
[im 50/60  lung]
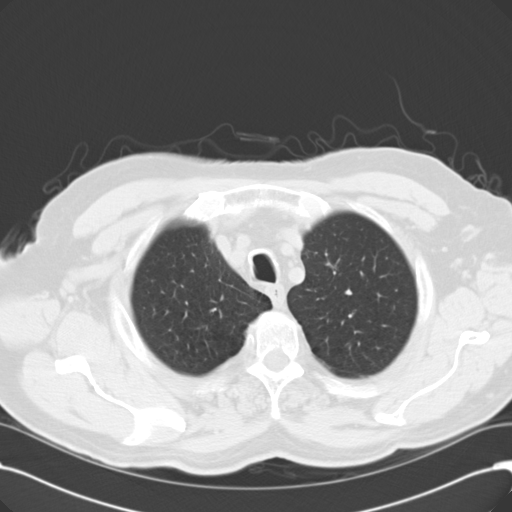
[im 55/60  lung]
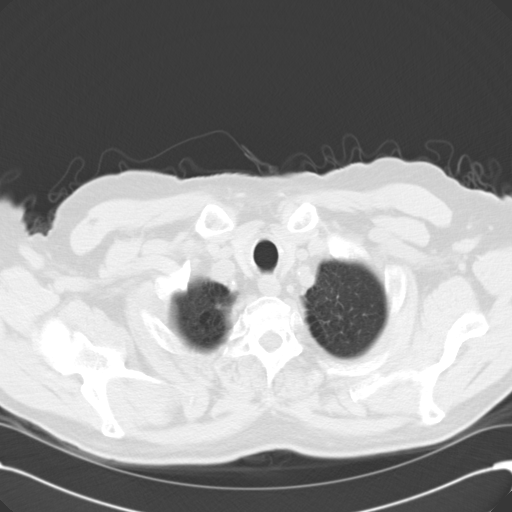

[Series 602: cor · coronal · 0.71mm/px · 3 of 109 slices shown]
[im 22/109  lung]
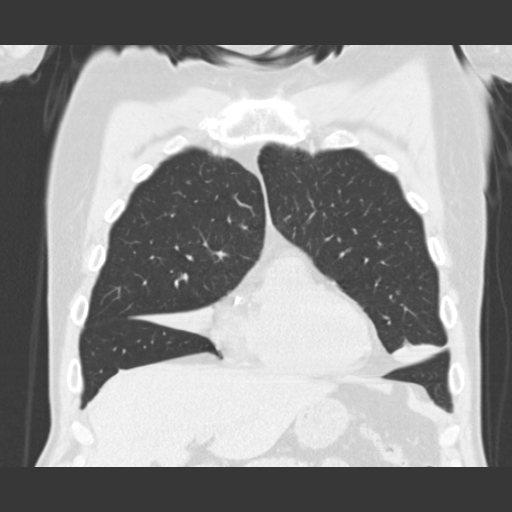
[im 44/109  lung]
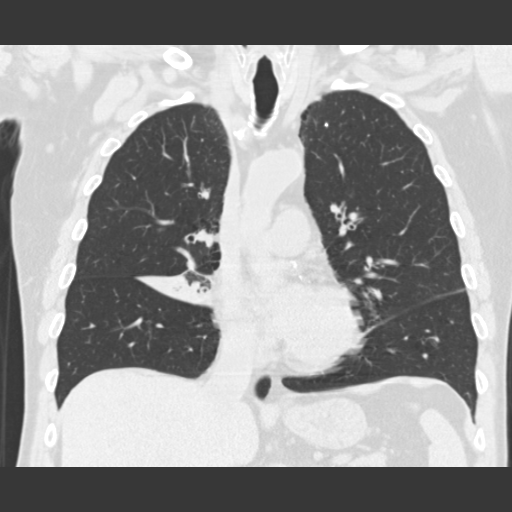
[im 65/109  lung]
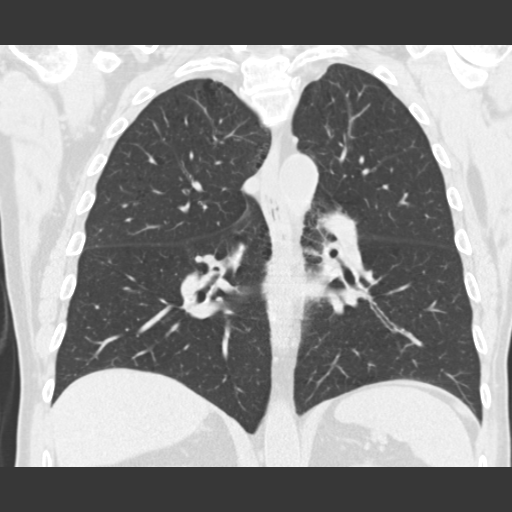

[15 of 36 positions shown; findings below may reference images not displayed]

FINDINGS: Mediastinum: Images 32 of series [DATE] of series 602 and 65 of
series 603 demonstrate narrowing at the origin of the right middle
lobe bronchus. This narrowing appears to be intrinsic to the
bronchus, without definite surrounding mass or endobronchial lesion
identified on today's non contrast CT examination. No pathologically
enlarged mediastinal or hilar lymph nodes. Please note that accurate
exclusion of hilar adenopathy is limited on noncontrast CT scans.
Heart size is normal. There is no significant pericardial fluid,
thickening or pericardial calcification. There is atherosclerosis of
the thoracic aorta, the great vessels of the mediastinum and the
coronary arteries, including calcified atherosclerotic plaque in the
left main, left anterior descending, left circumflex and right
coronary arteries. Esophagus is unremarkable in appearance.

Lungs/Pleura: Near complete right middle lobe atelectasis.
Background of mild centrilobular and paraseptal emphysema, most
pronounced in the lung apices. Mild diffuse bronchial wall
thickening. No definite suspicious appearing pulmonary nodules or
masses. Mild scarring or subsegmental atelectasis in the inferior
segment of the lingula is also noted. No pleural effusions.

Upper Abdomen: Unremarkable.

Musculoskeletal: There are no aggressive appearing lytic or blastic
lesions noted in the visualized portions of the skeleton.
IMPRESSION: 1. Near complete atelectasis of the right middle lobe appears to be
associated with narrowing of the proximal right middle lobe
bronchus. This narrowing may be related to a post infectious or
inflammatory stricture, as there is no definite extrinsic mass or
endobronchial lesion identified on today's non contrast CT
examination. Correlation with bronchoscopy may provide additional
diagnostic information if clinically appropriate.
2. Mild diffuse bronchial wall thickening with mild centrilobular
and paraseptal emphysema; findings suggestive of underlying COPD.
3. Atherosclerosis, including left main and 3 vessel coronary artery
disease. Assessment for potential risk factor modification, dietary
therapy or pharmacologic therapy may be warranted, if clinically
indicated.

## 2015-11-13 DIAGNOSIS — R600 Localized edema: Secondary | ICD-10-CM

## 2015-11-13 DIAGNOSIS — R5381 Other malaise: Secondary | ICD-10-CM | POA: Insufficient documentation

## 2015-11-13 DIAGNOSIS — Z9981 Dependence on supplemental oxygen: Secondary | ICD-10-CM | POA: Insufficient documentation

## 2015-11-13 DIAGNOSIS — E7800 Pure hypercholesterolemia, unspecified: Secondary | ICD-10-CM

## 2015-11-13 DIAGNOSIS — Z79899 Other long term (current) drug therapy: Secondary | ICD-10-CM

## 2015-11-13 DIAGNOSIS — R972 Elevated prostate specific antigen [PSA]: Secondary | ICD-10-CM | POA: Insufficient documentation

## 2015-11-13 DIAGNOSIS — N400 Enlarged prostate without lower urinary tract symptoms: Secondary | ICD-10-CM | POA: Insufficient documentation

## 2015-11-13 DIAGNOSIS — G121 Other inherited spinal muscular atrophy: Secondary | ICD-10-CM

## 2015-11-13 DIAGNOSIS — I10 Essential (primary) hypertension: Secondary | ICD-10-CM | POA: Insufficient documentation

## 2015-11-13 HISTORY — DX: Localized edema: R60.0

## 2015-11-13 HISTORY — DX: Benign prostatic hyperplasia without lower urinary tract symptoms: N40.0

## 2015-11-13 HISTORY — DX: Other malaise: R53.81

## 2015-11-13 HISTORY — DX: Dependence on supplemental oxygen: Z99.81

## 2015-11-13 HISTORY — DX: Other long term (current) drug therapy: Z79.899

## 2015-11-13 HISTORY — DX: Other inherited spinal muscular atrophy: G12.1

## 2015-11-13 HISTORY — DX: Essential (primary) hypertension: I10

## 2015-11-13 HISTORY — DX: Elevated prostate specific antigen (PSA): R97.20

## 2015-11-13 HISTORY — DX: Pure hypercholesterolemia, unspecified: E78.00

## 2016-05-11 DIAGNOSIS — R7989 Other specified abnormal findings of blood chemistry: Secondary | ICD-10-CM

## 2016-05-11 HISTORY — DX: Other specified abnormal findings of blood chemistry: R79.89

## 2017-11-16 DIAGNOSIS — R21 Rash and other nonspecific skin eruption: Secondary | ICD-10-CM

## 2017-11-16 HISTORY — DX: Rash and other nonspecific skin eruption: R21

## 2019-02-27 DIAGNOSIS — R7303 Prediabetes: Secondary | ICD-10-CM | POA: Insufficient documentation

## 2019-02-27 HISTORY — DX: Prediabetes: R73.03

## 2019-10-16 DIAGNOSIS — I739 Peripheral vascular disease, unspecified: Secondary | ICD-10-CM | POA: Insufficient documentation

## 2019-10-16 HISTORY — DX: Peripheral vascular disease, unspecified: I73.9

## 2020-11-04 DIAGNOSIS — Z91199 Patient's noncompliance with other medical treatment and regimen due to unspecified reason: Secondary | ICD-10-CM | POA: Insufficient documentation

## 2020-11-04 DIAGNOSIS — Z5320 Procedure and treatment not carried out because of patient's decision for unspecified reasons: Secondary | ICD-10-CM

## 2020-11-04 HISTORY — DX: Patient's noncompliance with other medical treatment and regimen due to unspecified reason: Z91.199

## 2020-11-04 HISTORY — DX: Procedure and treatment not carried out because of patient's decision for unspecified reasons: Z53.20

## 2021-03-16 DIAGNOSIS — H2510 Age-related nuclear cataract, unspecified eye: Secondary | ICD-10-CM

## 2022-04-14 ENCOUNTER — Encounter: Payer: Self-pay | Admitting: *Deleted

## 2022-04-14 ENCOUNTER — Encounter: Payer: Self-pay | Admitting: Cardiology

## 2022-04-15 DIAGNOSIS — N400 Enlarged prostate without lower urinary tract symptoms: Secondary | ICD-10-CM | POA: Insufficient documentation

## 2022-04-15 DIAGNOSIS — J449 Chronic obstructive pulmonary disease, unspecified: Secondary | ICD-10-CM | POA: Insufficient documentation

## 2022-04-19 ENCOUNTER — Encounter: Payer: Self-pay | Admitting: Cardiology

## 2022-04-19 ENCOUNTER — Ambulatory Visit: Payer: Medicare Other | Attending: Cardiology | Admitting: Cardiology

## 2022-04-19 VITALS — BP 150/78 | HR 102 | Ht 68.0 in | Wt 205.6 lb

## 2022-04-19 DIAGNOSIS — Z5320 Procedure and treatment not carried out because of patient's decision for unspecified reasons: Secondary | ICD-10-CM | POA: Diagnosis present

## 2022-04-19 DIAGNOSIS — I10 Essential (primary) hypertension: Secondary | ICD-10-CM | POA: Diagnosis not present

## 2022-04-19 DIAGNOSIS — Z9981 Dependence on supplemental oxygen: Secondary | ICD-10-CM

## 2022-04-19 DIAGNOSIS — J431 Panlobular emphysema: Secondary | ICD-10-CM

## 2022-04-19 DIAGNOSIS — R011 Cardiac murmur, unspecified: Secondary | ICD-10-CM

## 2022-04-19 DIAGNOSIS — R0609 Other forms of dyspnea: Secondary | ICD-10-CM

## 2022-04-19 HISTORY — DX: Other forms of dyspnea: R06.09

## 2022-04-19 HISTORY — DX: Cardiac murmur, unspecified: R01.1

## 2022-04-19 NOTE — Patient Instructions (Signed)
Medication Instructions:  Your physician recommends that you continue on your current medications as directed. Please refer to the Current Medication list given to you today.  *If you need a refill on your cardiac medications before your next appointment, please call your pharmacy*   Lab Work: None ordered If you have labs (blood work) drawn today and your tests are completely normal, you will receive your results only by: MyChart Message (if you have MyChart) OR A paper copy in the mail If you have any lab test that is abnormal or we need to change your treatment, we will call you to review the results.   Testing/Procedures: Your physician has requested that you have a lexiscan myoview. For further information please visit [email or web address]. Please follow instruction sheet, as given.  The test will take approximately 3 to 4 hours to complete; you may bring reading material.  If someone comes with you to your appointment, they will need to remain in the main lobby due to limited space in the testing area.   How to prepare for your Myocardial Perfusion Test: Do not eat or drink 3 hours prior to your test, except you may have water. Do not consume products containing caffeine (regular or decaffeinated) 12 hours prior to your test. (ex: coffee, chocolate, sodas, tea). Do bring a list of your current medications with you.  If not listed below, you may take your medications as normal. Do wear comfortable clothes (no dresses or overalls) and walking shoes, tennis shoes preferred (No heels or open toe shoes are allowed). Do NOT wear cologne, perfume, aftershave, or lotions (deodorant is allowed). If these instructions are not followed, your test will have to be rescheduled.  Your physician has requested that you have an echocardiogram. Echocardiography is a painless test that uses sound waves to create images of your heart. It provides your doctor with information about the size and shape of  your heart and how well your heart's chambers and valves are working. This procedure takes approximately one hour. There are no restrictions for this procedure.   Follow-Up: At [facility], you and your health needs are our priority.  As part of our continuing mission to provide you with exceptional heart care, we have created designated Provider Care Teams.  These Care Teams include your primary Cardiologist (physician) and Advanced Practice Providers (APPs -  Physician Assistants and Nurse Practitioners) who all work together to provide you with the care you need, when you need it.  We recommend signing up for the patient portal called "MyChart".  Sign up information is provided on this After Visit Summary.  MyChart is used to connect with patients for Virtual Visits (Telemedicine).  Patients are able to view lab/test results, encounter notes, upcoming appointments, etc.  Non-urgent messages can be sent to your provider as well.   To learn more about what you can do with MyChart, go to [email or web address].    Your next appointment:   9 month(s)  The format for your next appointment:   In Person  Provider:   Rajan Revankar, MD   Other Instructions Cardiac Nuclear Scan A cardiac nuclear scan is a test that is done to check the flow of blood to your heart. It is done when you are resting and when you are exercising. The test looks for problems such as: Not enough blood reaching a portion of the heart. The heart muscle not working as it should. You may need this test if: You have heart disease. You have   had lab results that are not normal. You have had heart surgery or a balloon procedure to open up blocked arteries (angioplasty). You have chest pain. You have shortness of breath. In this test, a special dye (tracer) is put into your bloodstream. The tracer will travel to your heart. A camera will then take pictures of your heart to see how the tracer moves through your heart. This  test is usually done at a hospital and takes 2-4 hours. Tell a doctor about: Any allergies you have. All medicines you are taking, including vitamins, herbs, eye drops, creams, and over-the-counter medicines. Any problems you or family members have had with anesthetic medicines. Any blood disorders you have. Any surgeries you have had. Any medical conditions you have. Whether you are pregnant or may be pregnant. What are the risks? Generally, this is a safe test. However, problems may occur, such as: Serious chest pain and heart attack. This is only a risk if the stress portion of the test is done. Rapid heartbeat. A feeling of warmth in your chest. This feeling usually does not last long. Allergic reaction to the tracer. What happens before the test? Ask your doctor about changing or stopping your normal medicines. This is important. Follow instructions from your doctor about what you cannot eat or drink. Remove your jewelry on the day of the test. What happens during the test? An IV tube will be inserted into one of your veins. Your doctor will give you a small amount of tracer through the IV tube. You will wait for 20-40 minutes while the tracer moves through your bloodstream. Your heart will be monitored with an electrocardiogram (ECG). You will lie down on an exam table. Pictures of your heart will be taken for about 15-20 minutes. You may also have a stress test. For this test, one of these things may be done: You will be asked to exercise on a treadmill or a stationary bike. You will be given medicines that will make your heart work harder. This is done if you are unable to exercise. When blood flow to your heart has peaked, a tracer will again be given through the IV tube. After 20-40 minutes, you will get back on the exam table. More pictures will be taken of your heart. Depending on the tracer that is used, more pictures may need to be taken 3-4 hours later. Your IV tube  will be removed when the test is over. The test may vary among doctors and hospitals. What happens after the test? Ask your doctor: Whether you can return to your normal schedule, including diet, activities, and medicines. Whether you should drink more fluids. This will help to remove the tracer from your body. Drink enough fluid to keep your pee (urine) pale yellow. Ask your doctor, or the department that is doing the test: When will my results be ready? How will I get my results? Summary A cardiac nuclear scan is a test that is done to check the flow of blood to your heart. Tell your doctor whether you are pregnant or may be pregnant. Before the test, ask your doctor about changing or stopping your normal medicines. This is important. Ask your doctor whether you can return to your normal activities. You may be asked to drink more fluids. This information is not intended to replace advice given to you by your health care provider. Make sure you discuss any questions you have with your health care provider. Document Revised: 08/23/2018 Document Reviewed: 10/17/2017   Elsevier Patient Education  2021 [employer]    Echocardiogram An echocardiogram is a test that uses sound waves (ultrasound) to produce images of the heart. Images from an echocardiogram can provide important information about: Heart size and shape. The size and thickness and movement of your heart's walls. Heart muscle function and strength. Heart valve function or if you have stenosis. Stenosis is when the heart valves are too narrow. If blood is flowing backward through the heart valves (regurgitation). A tumor or infectious growth around the heart valves. Areas of heart muscle that are not working well because of poor blood flow or injury from a heart attack. Aneurysm detection. An aneurysm is a weak or damaged part of an artery wall. The wall bulges out from the normal force of blood pumping through the body. Tell a  health care provider about: Any allergies you have. All medicines you are taking, including vitamins, herbs, eye drops, creams, and over-the-counter medicines. Any blood disorders you have. Any surgeries you have had. Any medical conditions you have. Whether you are pregnant or may be pregnant. What are the risks? Generally, this is a safe test. However, problems may occur, including an allergic reaction to dye (contrast) that may be used during the test. What happens before the test? No specific preparation is needed. You may eat and drink normally. What happens during the test? You will take off your clothes from the waist up and put on a hospital gown. Electrodes or electrocardiogram (ECG)patches may be placed on your chest. The electrodes or patches are then connected to a device that monitors your heart rate and rhythm. You will lie down on a table for an ultrasound exam. A gel will be applied to your chest to help sound waves pass through your skin. A handheld device, called a transducer, will be pressed against your chest and moved over your heart. The transducer produces sound waves that travel to your heart and bounce back (or "echo" back) to the transducer. These sound waves will be captured in real-time and changed into images of your heart that can be viewed on a video monitor. The images will be recorded on a computer and reviewed by your health care provider. You may be asked to change positions or hold your breath for a short time. This makes it easier to get different views or better views of your heart. In some cases, you may receive contrast through an IV in one of your veins. This can improve the quality of the pictures from your heart. The procedure may vary among health care providers and hospitals.    What can I expect after the test? You may return to your normal, everyday life, including diet, activities, and medicines, unless your health care provider tells you not to do  that. Follow these instructions at home: It is up to you to get the results of your test. Ask your health care provider, or the department that is doing the test, when your results will be ready. Keep all follow-up visits. This is important. Summary An echocardiogram is a test that uses sound waves (ultrasound) to produce images of the heart. Images from an echocardiogram can provide important information about the size and shape of your heart, heart muscle function, heart valve function, and other possible heart problems. You do not need to do anything to prepare before this test. You may eat and drink normally. After the echocardiogram is completed, you may return to your normal, everyday life, unless your   health care provider tells you not to do that. This information is not intended to replace advice given to you by your health care provider. Make sure you discuss any questions you have with your health care provider. Document Revised: 12/25/2019 Document Reviewed: 12/25/2019 Elsevier Patient Education  2021 [employer]  

## 2022-04-19 NOTE — Progress Notes (Signed)
Cardiology Office Note:    Date:  04/19/2022   ID:  Craig Rodriguez, DOB 07-09-1933, MRN [id number]  PCP:  Craig Camel, NP  Cardiologist:  Craig Lindau, MD   Referring MD: Craig Rodriguez*    ASSESSMENT:    1. Dyspnea on exertion   2. Cardiac murmur   3. Essential hypertension   4. Panlobular emphysema ([place])   5. Oxygen dependent   6. Refusal of statin medication by patient    PLAN:    In order of problems listed above:  Atherosclerotic vascular disease and dyspnea on exertion: Patient has dyspnea on exertion that this can be multifactorial.  He leads a sedentary lifestyle.  He has morbid abdominal obesity, he has COPD on oxygen and he has atherosclerotic vascular disease.  To evaluate this we will do a Lexiscan sestamibi and he is agreeable.  This will help [place] assess for any objective evidence of obstructive coronary artery disease which may be a source of his exertional dyspnea. Essential hypertension: Blood pressure is stable and diet was emphasized.  He appears a little anxious today.  He mentions to me that his blood pressures are fine at home. Cardiac murmur: Echocardiogram will be done to assess murmur heard on auscultation. COPD on supplemental oxygen: Managed by primary care.  He has significant reduction in DLCO as noted by his pulmonary function test.  He is he has a pulmonologist for this with Patient will be seen in follow-up appointment in 6 months or earlier if the patient has any concerns    Medication Adjustments/Labs and Tests Ordered: Current medicines are reviewed at length with the patient today.  Concerns regarding medicines are outlined above.  No orders of the defined types were placed in this encounter.  No orders of the defined types were placed in this encounter.    History of Present Illness:    Craig Rodriguez is a 88 y.o. male who is being seen today for the evaluation of pedal edema and dyspnea on at the request of  Craig Rodriguez, Craig Rodriguez Kitchen*.  Patient is a pleasant 88 year old patient.  She has past medical history of cigarette smoking, essential hypertension, COPD on supplemental oxygen and vascular disease.  He is referred here for pedal edema.  He was prescribed diuretics by primary care but this is not helping with the edema as he wants it to be.  No chest pain orthopnea or PND.  He leads a sedentary lifestyle because of a forementioned issues.  At the time of my evaluation, the patient is alert awake oriented and in no distress.  Past Medical History:  Diagnosis Date   Abnormal CT of the chest 09/04/2013   CT chest 08/2013:  RML collapse, with narrowing of RML bronchus.  No definite malignancy   Abnormal morphology of platelets 05/11/2016   Adult spinal muscular atrophy ([place]) 11/13/2015   Benign prostatic hyperplasia without lower urinary tract symptoms 11/13/2015   Last Assessment & Plan: Formatting of this note might be different from the original. This is stable for him at this time and will follow   COPD (chronic obstructive pulmonary disease) ([place])    COPD with emphysema ([place]) 08/31/2013   PFTs 2015:  FEV1 1.30 (45%), ratio 50, 22% increase in FEV1 with BD, +airtrapping, DLCO 52% pred  Refused anticholinergic treatment  Refused pulmonary rehab.    Encounter for long-term (current) use of high-risk medication 11/13/2015   Enlarged prostate    Essential hypertension 11/13/2015   Last Assessment & Plan:  Formatting of this note might be different from the original. On repeat this is stable for him and he is taking his lasix as directed   Hypercholesterolemia 11/13/2015   Last Assessment & Plan: Formatting of this note might be different from the original. Update his lipids for him today   Localized edema 11/13/2015   Last Assessment & Plan: Formatting of this note might be different from the original. Stable with the use of the lasix   Malaise and fatigue 11/13/2015   Last Assessment & Plan: Formatting  of this note might be different from the original. Up and down for him with there being more of it when he has exerted himself which he tries to avoid   Noncompliance 11/04/2020   Oxygen dependent 11/13/2015   Last Assessment & Plan: Formatting of this note might be different from the original. Wears this at night at 2 liters and rarely through the day if he needs it he will   Peripheral vascular disease of lower extremity ([place]) 10/16/2019   Prediabetes 02/27/2019   Rash 11/16/2017   Refusal of statin medication by patient 11/04/2020   Senile nuclear sclerosis 03/16/2021    Past Surgical History:  Procedure Laterality Date   VIDEO BRONCHOSCOPY Bilateral 11/06/2013   Procedure: VIDEO BRONCHOSCOPY WITHOUT FLUORO;  Surgeon: Craig Delton, MD;  Location: [facility];  Service: Cardiopulmonary;  Laterality: Bilateral;    Current Medications: Current Meds  Medication Sig   Cyanocobalamin (B-12 COMPLIANCE INJECTION) 1000 MCG/ML KIT Inject 1,000 mg as directed every 30 (thirty) days.   finasteride (PROSCAR) 5 MG tablet Take 5 mg by mouth daily.   fluticasone (FLONASE) 50 MCG/ACT nasal spray Place 2 sprays into both nostrils daily.   Fluticasone-Umeclidin-Vilant (TRELEGY ELLIPTA) 200-62.5-25 MCG/ACT AEPB Inhale 1 puff into the lungs daily.   furosemide (LASIX) 20 MG tablet Take 20 mg by mouth daily in the afternoon. At 2 pm   furosemide (LASIX) 40 MG tablet Take 40 mg by mouth every morning.   tamsulosin (FLOMAX) 0.4 MG CAPS capsule Take 1 tablet by mouth daily.   triamcinolone cream (KENALOG) 0.1 % Apply 1 Application topically 2 (two) times daily.     Allergies:   Patient has no known allergies.   Social History   Socioeconomic History   Marital status: Widowed    Spouse name: Not on file   Number of children: Not on file   Years of education: Not on file   Highest education level: Not on file  Occupational History   Occupation: retired  Tobacco Use   Smoking status: Former     Packs/day: 1.00    Years: 60.00    Total pack years: 60.00    Types: Cigarettes    Quit date: 05/19/2013    Years since quitting: 8.9   Smokeless tobacco: Never   Tobacco comments:    h/o 2 PPD in past  Substance and Sexual Activity   Alcohol use: Yes    Comment: 1 drink once a day   Drug use: No   Sexual activity: Not on file  Other Topics Concern   Not on file  Social History Narrative   Not on file   Social Determinants of Health   Financial Resource Strain: Not on file  Food Insecurity: Not on file  Transportation Needs: Not on file  Physical Activity: Not on file  Stress: Not on file  Social Connections: Not on file     Family History: The patient's family history includes Cancer  in his mother; Diabetes in his sister and sister; Kidney failure in his father.  ROS:   Please see the history of present illness.    All other systems reviewed and are negative.  EKGs/Labs/Other Studies Reviewed:    The following studies were reviewed today: EKG reveals sinus rhythm with tachycardia with poor anterior forces   Recent Labs: No results found for requested labs within last 365 days.  Recent Lipid Panel No results found for: "CHOL", "TRIG", "HDL", "CHOLHDL", "VLDL", "LDLCALC", "LDLDIRECT"  Physical Exam:    VS:  BP (!) 150/78   Pulse (!) 102   Ht _0  (1.727 m)   Wt 205 lb 9.6 oz (93.3 kg)   SpO2 95%   BMI 31.26 kg/m     Wt Readings from Last 3 Encounters:  04/19/22 205 lb 9.6 oz (93.3 kg)  04/07/22 198 lb (89.8 kg)  09/11/13 183 lb 9.6 oz (83.3 kg)     GEN: Patient is in no acute distress HEENT: Normal NECK: No JVD; No carotid bruits LYMPHATICS: No lymphadenopathy CARDIAC: S1 S2 regular, 2/6 systolic murmur at the apex. RESPIRATORY:  Clear to auscultation without rales, wheezing or rhonchi  ABDOMEN: Soft, non-tender, non-distended MUSCULOSKELETAL: Bilateral pedal edema; No deformity  SKIN: Warm and dry NEUROLOGIC:  Alert and oriented x 3 PSYCHIATRIC:   Normal affect    Signed, Craig Lindau, MD  04/19/2022 1:22 PM    [facility]

## 2022-04-20 ENCOUNTER — Telehealth (HOSPITAL_COMMUNITY): Payer: Self-pay

## 2022-04-20 NOTE — Telephone Encounter (Signed)
Spoke with the patient's daughter. She stated that she would have him here for his test. Asked to call back with any questions.S.Mayford Knife EMTP

## 2022-04-22 ENCOUNTER — Ambulatory Visit: Payer: Medicare Other

## 2022-04-22 ENCOUNTER — Encounter: Payer: Self-pay | Admitting: Cardiology

## 2022-04-22 ENCOUNTER — Ambulatory Visit: Payer: Medicare Other | Attending: Cardiology | Admitting: Cardiology

## 2022-04-22 VITALS — BP 114/60 | HR 166 | Ht 63.0 in | Wt 205.0 lb

## 2022-04-22 DIAGNOSIS — I509 Heart failure, unspecified: Secondary | ICD-10-CM | POA: Insufficient documentation

## 2022-04-22 DIAGNOSIS — Z9981 Dependence on supplemental oxygen: Secondary | ICD-10-CM | POA: Insufficient documentation

## 2022-04-22 DIAGNOSIS — I480 Paroxysmal atrial fibrillation: Secondary | ICD-10-CM | POA: Diagnosis not present

## 2022-04-22 DIAGNOSIS — R0609 Other forms of dyspnea: Secondary | ICD-10-CM | POA: Insufficient documentation

## 2022-04-22 DIAGNOSIS — J431 Panlobular emphysema: Secondary | ICD-10-CM | POA: Diagnosis not present

## 2022-04-22 DIAGNOSIS — I38 Endocarditis, valve unspecified: Secondary | ICD-10-CM | POA: Diagnosis present

## 2022-04-22 MED ORDER — TECHNETIUM TC 99M TETROFOSMIN IV KIT
10.7000 | PACK | Freq: Once | INTRAVENOUS | Status: AC | PRN
  Administered 2022-04-22: 10.7 via INTRAVENOUS

## 2022-04-22 NOTE — Progress Notes (Unsigned)
Patient was not able to complete his Stress Test on 04/22/2022 due to abnormal EKG. Patient 's heart rate was 166 beats per minute. Abnormal EKG was taken and reviewed by Dr. Dulce Sellar. Dr. Dulce Sellar did a follow up with the patient. Stress Test was cancelled and patient was taken to the hospital.

## 2022-04-22 NOTE — Progress Notes (Signed)
Cardiology Office Note:    Date:  04/22/2022   ID:  Craig Rodriguez, DOB Jul 13, 1933, MRN [id number]  PCP:  Mayer Camel, NP  Cardiologist:  Shirlee More, MD    Referring MD: Bess Harvest*    ASSESSMENT:    1. Paroxysmal atrial fibrillation (HCC)   2. Heart failure due to valvular disease, unspecified heart failure type ([place])   3. Panlobular emphysema ([place])   4. Oxygen dependent    PLAN:    In order of problems listed above:    What had been a little active outpatient myocardial perfusion test is evolved into an urgent evaluation he is in rapid uncontrolled atrial fibrillation and severe decompensated heart failure this is beyond the bounds of office treatment and I have told the patient's daughter I think he is best served by EMS transport [facility] rapid rate control evaluation and treatment of heart failure echocardiogram be appropriate as an inpatient and after admission follow-up with Dr. Agustin Cree who is in the hospital this week    Medication Adjustments/Labs and Tests Ordered: Current medicines are reviewed at length with the patient today.  Concerns regarding medicines are outlined above.  No orders of the defined types were placed in this encounter.  No orders of the defined types were placed in this encounter.   Came for stress test heart rate 160 bpm   History of Present Illness:    Craig Rodriguez is a 88 y.o. male with a hx of COPD oxygen dependent last seen by my partner Dr. Geraldo Pitter in the office with decompensation profound edema shortness of breath heart murmur echocardiogram ordered not a performed and he had a Lexiscan myocardial perfusion study ordered he came in this morning heart rate greater than 160 rhythm looks regular brought him back thinking it might be atrial flutter or atrial tachycardia is obviously in atrial fibrillation with a rapid ventricular rate of 143 bpm.  It is obvious he has anasarca likely has severe  decompensated heart failure and requires inpatient care and EMS will be called and transferred to the emergency room. He does not have palpitation but he has intermittent episodes where he becomes very breathless and he said people of question if he had atrial fibrillation in the past. Compliance with diet, lifestyle and medications: Yes Past Medical History:  Diagnosis Date   Abnormal CT of the chest 09/04/2013   CT chest 08/2013:  RML collapse, with narrowing of RML bronchus.  No definite malignancy   Abnormal morphology of platelets 05/11/2016   Adult spinal muscular atrophy ([place]) 11/13/2015   Benign prostatic hyperplasia without lower urinary tract symptoms 11/13/2015   Last Assessment & Plan: Formatting of this note might be different from the original. This is stable for him at this time and will follow   COPD (chronic obstructive pulmonary disease) ([place])    COPD with emphysema ([place]) 08/31/2013   PFTs 2015:  FEV1 1.30 (45%), ratio 50, 22% increase in FEV1 with BD, +airtrapping, DLCO 52% pred  Refused anticholinergic treatment  Refused pulmonary rehab.    Encounter for long-term (current) use of high-risk medication 11/13/2015   Enlarged prostate    Essential hypertension 11/13/2015   Last Assessment & Plan: Formatting of this note might be different from the original. On repeat this is stable for him and he is taking his lasix as directed   Hypercholesterolemia 11/13/2015   Last Assessment & Plan: Formatting of this note might be different from the original. Update his lipids for him  today   Localized edema 11/13/2015   Last Assessment & Plan: Formatting of this note might be different from the original. Stable with the use of the lasix   Malaise and fatigue 11/13/2015   Last Assessment & Plan: Formatting of this note might be different from the original. Up and down for him with there being more of it when he has exerted himself which he tries to avoid   Noncompliance 11/04/2020    Oxygen dependent 11/13/2015   Last Assessment & Plan: Formatting of this note might be different from the original. Wears this at night at 2 liters and rarely through the day if he needs it he will   Peripheral vascular disease of lower extremity ([place]) 10/16/2019   Prediabetes 02/27/2019   Rash 11/16/2017   Refusal of statin medication by patient 11/04/2020   Senile nuclear sclerosis 03/16/2021    Past Surgical History:  Procedure Laterality Date   VIDEO BRONCHOSCOPY Bilateral 11/06/2013   Procedure: VIDEO BRONCHOSCOPY WITHOUT FLUORO;  Surgeon: Kathee Delton, MD;  Location: [facility];  Service: Cardiopulmonary;  Laterality: Bilateral;    Current Medications: Current Meds  Medication Sig   Cyanocobalamin (B-12 COMPLIANCE INJECTION) 1000 MCG/ML KIT Inject 1,000 mg as directed every 30 (thirty) days.   finasteride (PROSCAR) 5 MG tablet Take 5 mg by mouth daily.   fluticasone (FLONASE) 50 MCG/ACT nasal spray Place 2 sprays into both nostrils daily.   Fluticasone-Umeclidin-Vilant (TRELEGY ELLIPTA) 200-62.5-25 MCG/ACT AEPB Inhale 1 puff into the lungs daily.   furosemide (LASIX) 20 MG tablet Take 20 mg by mouth daily in the afternoon. At 2 pm   furosemide (LASIX) 40 MG tablet Take 40 mg by mouth every morning.   tamsulosin (FLOMAX) 0.4 MG CAPS capsule Take 1 tablet by mouth daily.   triamcinolone cream (KENALOG) 0.1 % Apply 1 Application topically 2 (two) times daily.     Allergies:   Patient has no known allergies.   Social History   Socioeconomic History   Marital status: Widowed    Spouse name: Not on file   Number of children: Not on file   Years of education: Not on file   Highest education level: Not on file  Occupational History   Occupation: retired  Tobacco Use   Smoking status: Former    Packs/day: 1.00    Years: 60.00    Total pack years: 60.00    Types: Cigarettes    Quit date: 05/19/2013    Years since quitting: 8.9   Smokeless tobacco: Never   Tobacco  comments:    h/o 2 PPD in past  Substance and Sexual Activity   Alcohol use: Yes    Comment: 1 drink once a day   Drug use: No   Sexual activity: Not on file  Other Topics Concern   Not on file  Social History Narrative   Not on file   Social Determinants of Health   Financial Resource Strain: Not on file  Food Insecurity: Not on file  Transportation Needs: Not on file  Physical Activity: Not on file  Stress: Not on file  Social Connections: Not on file     Family History: The patient's family history includes Cancer in his mother; Diabetes in his sister and sister; Kidney failure in his father. ROS:   Please see the history of present illness.    All other systems reviewed and are negative.  EKGs/Labs/Other Studies Reviewed:    The following studies were reviewed today:  EKG:  EKG ordered today and personally reviewed.  The ekg ordered today demonstrates atrial fibrillation rapid uncontrolled rate otherwise normal  Recent Labs:   Physical Exam:    VS:  BP 114/60 (BP Location: Right Arm, Patient Position: Sitting, Cuff Size: Normal)   Pulse (!) 166   Ht _0  (1.6 m)   Wt 205 lb (93 kg)   SpO2 99%   BMI 36.31 kg/m     Wt Readings from Last 3 Encounters:  04/22/22 205 lb (93 kg)  04/22/22 205 lb (93 kg)  04/19/22 205 lb 9.6 oz (93.3 kg)     GEN: He looks chronically ill COPD appearance he has anasarca and looks like he has marked ascites.  Well nourished, well developed in no acute distress HEENT: Normal NECK: No JVD; No carotid bruits LYMPHATICS: No lymphadenopathy CARDIAC: Rapid rate and irregular Heart rhythm RESPIRATORY: Diminished breath sounds but no wheezing ABDOMEN: Soft, non-tender, non-distended MUSCULOSKELETAL he has anasarca and ascites; No deformity  SKIN: Warm and dry NEUROLOGIC:  Alert and oriented x 3 PSYCHIATRIC:  Normal affect    Signed, Shirlee More, MD  04/22/2022 11:02 AM    [place]

## 2022-04-23 DIAGNOSIS — J449 Chronic obstructive pulmonary disease, unspecified: Secondary | ICD-10-CM

## 2022-04-23 DIAGNOSIS — I4891 Unspecified atrial fibrillation: Secondary | ICD-10-CM

## 2022-04-23 DIAGNOSIS — R600 Localized edema: Secondary | ICD-10-CM | POA: Diagnosis not present

## 2022-04-23 DIAGNOSIS — I509 Heart failure, unspecified: Secondary | ICD-10-CM

## 2022-04-24 DIAGNOSIS — I4891 Unspecified atrial fibrillation: Secondary | ICD-10-CM | POA: Diagnosis not present

## 2022-04-24 DIAGNOSIS — I10 Essential (primary) hypertension: Secondary | ICD-10-CM | POA: Diagnosis not present

## 2022-04-24 DIAGNOSIS — I509 Heart failure, unspecified: Secondary | ICD-10-CM | POA: Diagnosis not present

## 2022-04-25 DIAGNOSIS — I10 Essential (primary) hypertension: Secondary | ICD-10-CM | POA: Diagnosis not present

## 2022-04-25 DIAGNOSIS — I509 Heart failure, unspecified: Secondary | ICD-10-CM | POA: Diagnosis not present

## 2022-04-25 DIAGNOSIS — I4891 Unspecified atrial fibrillation: Secondary | ICD-10-CM | POA: Diagnosis not present

## 2022-04-26 ENCOUNTER — Telehealth: Payer: Self-pay | Admitting: Cardiology

## 2022-04-26 NOTE — Telephone Encounter (Signed)
Daughter called to say patients needs 2 wks follow up from being in hospital. But dr Tomie China doest have anything till feb. Please advise

## 2022-04-29 ENCOUNTER — Encounter: Payer: Self-pay | Admitting: Cardiology

## 2022-04-29 ENCOUNTER — Other Ambulatory Visit: Payer: Medicare Other

## 2022-04-29 ENCOUNTER — Other Ambulatory Visit: Payer: Self-pay

## 2022-05-04 DIAGNOSIS — I5043 Acute on chronic combined systolic (congestive) and diastolic (congestive) heart failure: Secondary | ICD-10-CM

## 2022-05-04 DIAGNOSIS — I480 Paroxysmal atrial fibrillation: Secondary | ICD-10-CM

## 2022-05-04 DIAGNOSIS — N1831 Chronic kidney disease, stage 3a: Secondary | ICD-10-CM | POA: Insufficient documentation

## 2022-05-04 DIAGNOSIS — D649 Anemia, unspecified: Secondary | ICD-10-CM

## 2022-05-04 HISTORY — DX: Acute on chronic combined systolic (congestive) and diastolic (congestive) heart failure: I50.43

## 2022-05-04 HISTORY — DX: Chronic kidney disease, stage 3a: N18.31

## 2022-05-04 HISTORY — DX: Anemia, unspecified: D64.9

## 2022-05-04 HISTORY — DX: Paroxysmal atrial fibrillation: I48.0

## 2022-05-07 ENCOUNTER — Other Ambulatory Visit: Payer: Self-pay

## 2022-05-11 ENCOUNTER — Ambulatory Visit: Payer: Medicare Other | Attending: Cardiology | Admitting: Cardiology

## 2022-05-11 ENCOUNTER — Encounter: Payer: Self-pay | Admitting: Cardiology

## 2022-05-11 VITALS — BP 108/58 | HR 86 | Ht 68.0 in | Wt 193.2 lb

## 2022-05-11 DIAGNOSIS — Z9981 Dependence on supplemental oxygen: Secondary | ICD-10-CM | POA: Diagnosis present

## 2022-05-11 DIAGNOSIS — J431 Panlobular emphysema: Secondary | ICD-10-CM | POA: Insufficient documentation

## 2022-05-11 DIAGNOSIS — I10 Essential (primary) hypertension: Secondary | ICD-10-CM | POA: Insufficient documentation

## 2022-05-11 DIAGNOSIS — I480 Paroxysmal atrial fibrillation: Secondary | ICD-10-CM | POA: Insufficient documentation

## 2022-05-11 MED ORDER — ELIQUIS 5 MG PO TABS: 5.0000 mg | ORAL_TABLET | Freq: Two times a day (BID) | ORAL | 3 refills | Status: DC

## 2022-05-11 MED ORDER — METOPROLOL SUCCINATE ER 25 MG PO TB24: 12.5000 mg | ORAL_TABLET | Freq: Every day | ORAL | 3 refills | Status: DC

## 2022-05-11 MED ORDER — POTASSIUM CHLORIDE CRYS ER 20 MEQ PO TBCR: 20.0000 meq | EXTENDED_RELEASE_TABLET | Freq: Every day | ORAL | 3 refills | Status: DC

## 2022-05-11 NOTE — Patient Instructions (Signed)
Medication Instructions:  Your physician recommends that you continue on your current medications as directed. Please refer to the Current Medication list given to you today.  *If you need a refill on your cardiac medications before your next appointment, please call your pharmacy*   Lab Work: Your physician recommends that you have a BMET done today in the office.  If you have labs (blood work) drawn today and your tests are completely normal, you will receive your results only by: MyChart Message (if you have MyChart) OR A paper copy in the mail If you have any lab test that is abnormal or we need to change your treatment, we will call you to review the results.   Testing/Procedures: None ordered   Follow-Up: At [facility], you and your health needs are our priority.  As part of our continuing mission to provide you with exceptional heart care, we have created designated Provider Care Teams.  These Care Teams include your primary Cardiologist (physician) and Advanced Practice Providers (APPs -  Physician Assistants and Nurse Practitioners) who all work together to provide you with the care you need, when you need it.  We recommend signing up for the patient portal called "MyChart".  Sign up information is provided on this After Visit Summary.  MyChart is used to connect with patients for Virtual Visits (Telemedicine).  Patients are able to view lab/test results, encounter notes, upcoming appointments, etc.  Non-urgent messages can be sent to your provider as well.   To learn more about what you can do with MyChart, go to [email or web address].    Your next appointment:   3 month(s)  The format for your next appointment:   In Person  Provider:   Belva Crome, MD    Other Instructions none  Important Information About Sugar

## 2022-05-11 NOTE — Progress Notes (Signed)
Cardiology Office Note:    Date:  05/11/2022   ID:  Craig Rodriguez, DOB 27-Feb-1934, MRN [id number]  PCP:  Mayer Camel, NP  Cardiologist:  Jenean Lindau, MD   Referring MD: Bess Harvest*    ASSESSMENT:    1. Essential hypertension   2. Paroxysmal A-fib (HCC)   3. Panlobular emphysema ([place])   4. Oxygen dependent    PLAN:    In order of problems listed above:  Primary prevention stressed with the patient.  Importance of compliance with diet medication stressed and she vocalized understanding. COPD on oxygen: Stable at this time.  Managed by primary care. Essential hypertension: Blood pressure is stable and diet was emphasized. Pedal edema: On diuretic therapy.  Ejection fraction is preserved.  Echocardiogram was reviewed.  Will do a Chem-7 today week Paroxysmal atrial fibrillation:I discussed with the patient atrial fibrillation, disease process. Management and therapy including rate and rhythm control, anticoagulation benefits and potential risks were discussed extensively with the patient. Patient had multiple questions which were answered to patient's satisfaction. Patient will be seen in follow-up appointment in 6 months or earlier if the patient has any concerns    Medication Adjustments/Labs and Tests Ordered: Current medicines are reviewed at length with the patient today.  Concerns regarding medicines are outlined above.  No orders of the defined types were placed in this encounter.  No orders of the defined types were placed in this encounter.    No chief complaint on file.    History of Present Illness:    Craig Rodriguez is a 88 y.o. male.  Patient has past medical history of COPD on oxygen, paroxysmal atrial fibrillation, essential hypertension and pedal edema.  He denies any problems at this time and takes care of activities of daily living.  No chest pain orthopnea or PND.  He was recently in the hospital for paroxysmal atrial  fibrillation.  Subsequently is fine and anticoagulation.  His daughter is very supportive and accompanies him for this visit.  At the time of my evaluation, the patient is alert awake oriented and in no distress.  Past Medical History:  Diagnosis Date   Abnormal CT of the chest 09/04/2013   CT chest 08/2013:  RML collapse, with narrowing of RML bronchus.  No definite malignancy   Abnormal morphology of platelets 05/11/2016   Acute on chronic combined systolic and diastolic congestive heart failure ([place]) 05/04/2022   Adult spinal muscular atrophy ([place]) 11/13/2015   Benign prostatic hyperplasia without lower urinary tract symptoms 11/13/2015   Last Assessment & Plan: Formatting of this note might be different from the original. This is stable for him at this time and will follow   Cardiac murmur 04/19/2022   COPD (chronic obstructive pulmonary disease) ([place])    COPD with emphysema ([place]) 08/31/2013   PFTs 2015:  FEV1 1.30 (45%), ratio 50, 22% increase in FEV1 with BD, +airtrapping, DLCO 52% pred  Refused anticholinergic treatment  Refused pulmonary rehab.    Dyspnea on exertion 04/19/2022   Encounter for long-term (current) use of high-risk medication 11/13/2015   Enlarged prostate    Essential hypertension 11/13/2015   Last Assessment & Plan: Formatting of this note might be different from the original. On repeat this is stable for him and he is taking his lasix as directed   Hypercholesterolemia 11/13/2015   Last Assessment & Plan: Formatting of this note might be different from the original. Update his lipids for him today   Localized edema 11/13/2015  Last Assessment & Plan: Formatting of this note might be different from the original. Stable with the use of the lasix   Malaise and fatigue 11/13/2015   Last Assessment & Plan: Formatting of this note might be different from the original. Up and down for him with there being more of it when he has exerted himself which he tries to avoid    Mild chronic anemia 05/04/2022   Noncompliance 11/04/2020   Oxygen dependent 11/13/2015   Last Assessment & Plan: Formatting of this note might be different from the original. Wears this at night at 2 liters and rarely through the day if he needs it he will   Paroxysmal A-fib ([place]) 05/04/2022   Peripheral vascular disease of lower extremity ([place]) 10/16/2019   Prediabetes 02/27/2019   Rash 11/16/2017   Refusal of statin medication by patient 11/04/2020   Senile nuclear sclerosis 03/16/2021   Stage 3a chronic kidney disease ([place]) 05/04/2022    Past Surgical History:  Procedure Laterality Date   VIDEO BRONCHOSCOPY Bilateral 11/06/2013   Procedure: VIDEO BRONCHOSCOPY WITHOUT FLUORO;  Surgeon: Kathee Delton, MD;  Location: [facility];  Service: Cardiopulmonary;  Laterality: Bilateral;    Current Medications: Current Meds  Medication Sig   Cyanocobalamin (B-12 COMPLIANCE INJECTION) 1000 MCG/ML KIT Inject 1,000 mg as directed every 30 (thirty) days.   ELIQUIS 5 MG TABS tablet Take 5 mg by mouth 2 (two) times daily.   finasteride (PROSCAR) 5 MG tablet Take 5 mg by mouth daily.   fluticasone (FLONASE) 50 MCG/ACT nasal spray Place 2 sprays into both nostrils daily.   Fluticasone-Umeclidin-Vilant (TRELEGY ELLIPTA) 200-62.5-25 MCG/ACT AEPB Inhale 1 puff into the lungs daily.   furosemide (LASIX) 40 MG tablet Take 40 mg by mouth every morning.   metoprolol succinate (TOPROL-XL) 25 MG 24 hr tablet Take 12.5 mg by mouth daily.   potassium chloride SA (KLOR-CON M) 20 MEQ tablet Take 20 mEq by mouth daily.   tamsulosin (FLOMAX) 0.4 MG CAPS capsule Take 1 tablet by mouth daily.   triamcinolone cream (KENALOG) 0.1 % Apply 1 Application topically 2 (two) times daily.     Allergies:   Patient has no known allergies.   Social History   Socioeconomic History   Marital status: Widowed    Spouse name: Not on file   Number of children: Not on file   Years of education: Not on file   Highest  education level: Not on file  Occupational History   Occupation: retired  Tobacco Use   Smoking status: Former    Packs/day: 1.00    Years: 60.00    Total pack years: 60.00    Types: Cigarettes    Quit date: 05/19/2013    Years since quitting: 8.9   Smokeless tobacco: Never   Tobacco comments:    h/o 2 PPD in past  Substance and Sexual Activity   Alcohol use: Yes    Comment: 1 drink once a day   Drug use: No   Sexual activity: Not on file  Other Topics Concern   Not on file  Social History Narrative   Not on file   Social Determinants of Health   Financial Resource Strain: Not on file  Food Insecurity: Not on file  Transportation Needs: Not on file  Physical Activity: Not on file  Stress: Not on file  Social Connections: Not on file     Family History: The patient's family history includes Cancer in his mother; Diabetes in his sister and  sister; Kidney failure in his father.  ROS:   Please see the history of present illness.    All other systems reviewed and are negative.  EKGs/Labs/Other Studies Reviewed:    The following studies were reviewed today: EKG with sinus rhythm PVCs and nonspecific ST-T changes   Recent Labs: No results found for requested labs within last 365 days.  Recent Lipid Panel No results found for: "CHOL", "TRIG", "HDL", "CHOLHDL", "VLDL", "LDLCALC", "LDLDIRECT"  Physical Exam:    VS:  BP (!) 108/58   Pulse 86   Ht _0  (1.727 m)   Wt 193 lb 3.2 oz (87.6 kg)   SpO2 97%   BMI 29.38 kg/m     Wt Readings from Last 3 Encounters:  05/11/22 193 lb 3.2 oz (87.6 kg)  04/22/22 205 lb (93 kg)  04/22/22 205 lb (93 kg)     GEN: Patient is in no acute distress HEENT: Normal NECK: No JVD; No carotid bruits LYMPHATICS: No lymphadenopathy CARDIAC: Hear sounds regular, 2/6 systolic murmur at the apex. RESPIRATORY:  Clear to auscultation without rales, wheezing or rhonchi  ABDOMEN: Soft, non-tender, non-distended MUSCULOSKELETAL:  No edema;  No deformity  SKIN: Warm and dry NEUROLOGIC:  Alert and oriented x 3 PSYCHIATRIC:  Normal affect   Signed, Jenean Lindau, MD  05/11/2022 12:59 PM    [place]

## 2022-05-12 LAB — BASIC METABOLIC PANEL
BUN/Creatinine Ratio: 24 (ref 10–24)
BUN: 29 mg/dL — ABNORMAL HIGH (ref 8–27)
CO2: 26 mmol/L (ref 20–29)
Calcium: 9.4 mg/dL (ref 8.6–10.2)
Chloride: 97 mmol/L (ref 96–106)
Creatinine, Ser: 1.23 mg/dL (ref 0.76–1.27)
Glucose: 88 mg/dL (ref 70–99)
Potassium: 5.1 mmol/L (ref 3.5–5.2)
Sodium: 137 mmol/L (ref 134–144)
eGFR: 57 mL/min/{1.73_m2} — ABNORMAL LOW (ref >59)

## 2022-08-18 ENCOUNTER — Ambulatory Visit: Payer: Medicare Other | Attending: Cardiology | Admitting: Cardiology

## 2022-08-18 ENCOUNTER — Encounter: Payer: Self-pay | Admitting: Cardiology

## 2022-08-18 VITALS — BP 158/70 | HR 84 | Ht 68.0 in | Wt 188.4 lb

## 2022-08-18 DIAGNOSIS — I480 Paroxysmal atrial fibrillation: Secondary | ICD-10-CM | POA: Diagnosis not present

## 2022-08-18 DIAGNOSIS — I10 Essential (primary) hypertension: Secondary | ICD-10-CM | POA: Insufficient documentation

## 2022-08-18 DIAGNOSIS — J431 Panlobular emphysema: Secondary | ICD-10-CM | POA: Diagnosis not present

## 2022-08-18 NOTE — Patient Instructions (Signed)
Medication Instructions:  Your physician recommends that you continue on your current medications as directed. Please refer to the Current Medication list given to you today.  *If you need a refill on your cardiac medications before your next appointment, please call your pharmacy*   Lab Work: None ordered If you have labs (blood work) drawn today and your tests are completely normal, you will receive your results only by: MyChart Message (if you have MyChart) OR A paper copy in the mail If you have any lab test that is abnormal or we need to change your treatment, we will call you to review the results.   Testing/Procedures: None ordered   Follow-Up: At [facility], you and your health needs are our priority.  As part of our continuing mission to provide you with exceptional heart care, we have created designated Provider Care Teams.  These Care Teams include your primary Cardiologist (physician) and Advanced Practice Providers (APPs -  Physician Assistants and Nurse Practitioners) who all work together to provide you with the care you need, when you need it.  We recommend signing up for the patient portal called "MyChart".  Sign up information is provided on this After Visit Summary.  MyChart is used to connect with patients for Virtual Visits (Telemedicine).  Patients are able to view lab/test results, encounter notes, upcoming appointments, etc.  Non-urgent messages can be sent to your provider as well.   To learn more about what you can do with MyChart, go to [email or web address].    Your next appointment:   9 month(s)  The format for your next appointment:   In Person  Provider:   Rajan Revankar, MD    Other Instructions none  Important Information About Sugar       

## 2022-08-18 NOTE — Progress Notes (Signed)
Cardiology Office Note:    Date:  08/18/2022   ID:  Craig Rodriguez, DOB [birth year], MRN [id number]  PCP:  Mayer Camel, NP  Cardiologist:  Jenean Lindau, MD   Referring MD: Bess Harvest*    ASSESSMENT:    1. Essential hypertension   2. Paroxysmal A-fib   3. Panlobular emphysema    PLAN:    In order of problems listed above:  Primary prevention stressed with the patient.  Importance of compliance with diet medication stressed and vocalized understanding. Essential hypertension: Blood pressure stable and diet was emphasized.  He told me of his blood pressure numbers at home and they are fine.  Lifestyle modification, diet and salt intake issues were discussed. Paroxysmal atrial fibrillation:I discussed with the patient atrial fibrillation, disease process. Management and therapy including rate and rhythm control, anticoagulation benefits and potential risks were discussed extensively with the patient. Patient had multiple questions which were answered to patient's satisfaction. COPD on supplemental oxygen: Stable at this time and managed by primary care. Patient will be seen in follow-up appointment in 6 months or earlier if the patient has any concerns.    Medication Adjustments/Labs and Tests Ordered: Current medicines are reviewed at length with the patient today.  Concerns regarding medicines are outlined above.  No orders of the defined types were placed in this encounter.  No orders of the defined types were placed in this encounter.    No chief complaint on file.    History of Present Illness:    Craig Rodriguez is a 89 y.o. male.  Patient has past medical history of essential hypertension, paroxysmal atrial fibrillation and COPD supplemental oxygen for 24 hours.  He denies any problems at this time and takes care of activities of daily living.  He has a supportive daughter accompanies him for this visit.  Patient mentions to me that his blood  pressures are better at home.  He mentions to me that his blood pressures are stable.  No palpitations or any such issues.  Past Medical History:  Diagnosis Date   Abnormal CT of the chest 09/04/2013   CT chest 08/2013:  RML collapse, with narrowing of RML bronchus.  No definite malignancy   Abnormal morphology of platelets 05/11/2016   Acute on chronic combined systolic and diastolic congestive heart failure 05/04/2022   Adult spinal muscular atrophy 11/13/2015   Benign prostatic hyperplasia without lower urinary tract symptoms 11/13/2015   Last Assessment & Plan: Formatting of this note might be different from the original. This is stable for him at this time and will follow   Cardiac murmur 04/19/2022   COPD (chronic obstructive pulmonary disease)    COPD with emphysema 08/31/2013   PFTs 2015:  FEV1 1.30 (45%), ratio 50, 22% increase in FEV1 with BD, +airtrapping, DLCO 52% pred  Refused anticholinergic treatment  Refused pulmonary rehab.    Dyspnea on exertion 04/19/2022   Elevated prostate specific antigen (PSA) 11/13/2015   Encounter for long-term (current) use of high-risk medication 11/13/2015   Enlarged prostate    Essential hypertension 11/13/2015   Last Assessment & Plan: Formatting of this note might be different from the original. On repeat this is stable for him and he is taking his lasix as directed   Hypercholesterolemia 11/13/2015   Last Assessment & Plan: Formatting of this note might be different from the original. Update his lipids for him today   Localized edema 11/13/2015   Last Assessment & Plan: Formatting of this note  might be different from the original. Stable with the use of the lasix   Malaise and fatigue 11/13/2015   Last Assessment & Plan: Formatting of this note might be different from the original. Up and down for him with there being more of it when he has exerted himself which he tries to avoid   Mild chronic anemia 05/04/2022   Noncompliance 11/04/2020    Oxygen dependent 11/13/2015   Last Assessment & Plan: Formatting of this note might be different from the original. Wears this at night at 2 liters and rarely through the day if he needs it he will   Paroxysmal A-fib 05/04/2022   Peripheral vascular disease of lower extremity 10/16/2019   Prediabetes 02/27/2019   Rash 11/16/2017   Refusal of statin medication by patient 11/04/2020   Senile nuclear sclerosis 03/16/2021   Stage 3a chronic kidney disease 05/04/2022    Past Surgical History:  Procedure Laterality Date   VIDEO BRONCHOSCOPY Bilateral 11/06/2013   Procedure: VIDEO BRONCHOSCOPY WITHOUT FLUORO;  Surgeon: Kathee Delton, MD;  Location: [facility];  Service: Cardiopulmonary;  Laterality: Bilateral;    Current Medications: Current Meds  Medication Sig   Cyanocobalamin (B-12 COMPLIANCE INJECTION) 1000 MCG/ML KIT Inject 1,000 mg as directed every 30 (thirty) days.   ELIQUIS 5 MG TABS tablet Take 1 tablet (5 mg total) by mouth 2 (two) times daily.   finasteride (PROSCAR) 5 MG tablet Take 5 mg by mouth daily.   fluticasone (FLONASE) 50 MCG/ACT nasal spray Place 2 sprays into both nostrils daily.   Fluticasone-Salmeterol (ADVAIR HFA IN) Inhale 1 puff into the lungs 2 (two) times daily.   furosemide (LASIX) 40 MG tablet Take 40 mg by mouth every morning.   metoprolol succinate (TOPROL-XL) 25 MG 24 hr tablet Take 0.5 tablets (12.5 mg total) by mouth daily.   potassium chloride SA (KLOR-CON M) 20 MEQ tablet Take 1 tablet (20 mEq total) by mouth daily.   tamsulosin (FLOMAX) 0.4 MG CAPS capsule Take 1 tablet by mouth daily.   triamcinolone cream (KENALOG) 0.1 % Apply 1 Application topically 2 (two) times daily.     Allergies:   Patient has no known allergies.   Social History   Socioeconomic History   Marital status: Widowed    Spouse name: Not on file   Number of children: Not on file   Years of education: Not on file   Highest education level: Not on file  Occupational  History   Occupation: retired  Tobacco Use   Smoking status: Former    Packs/day: 1.00    Years: 60.00    Additional pack years: 0.00    Total pack years: 60.00    Types: Cigarettes    Quit date: 05/19/2013    Years since quitting: 9.2   Smokeless tobacco: Never   Tobacco comments:    h/o 2 PPD in past  Substance and Sexual Activity   Alcohol use: Yes    Comment: 1 drink once a day   Drug use: No   Sexual activity: Not on file  Other Topics Concern   Not on file  Social History Narrative   Not on file   Social Determinants of Health   Financial Resource Strain: Not on file  Food Insecurity: Not on file  Transportation Needs: Not on file  Physical Activity: Not on file  Stress: Not on file  Social Connections: Not on file     Family History: The patient's family history includes Cancer in his  mother; Diabetes in his sister and sister; Kidney failure in his father.  ROS:   Please see the history of present illness.    All other systems reviewed and are negative.  EKGs/Labs/Other Studies Reviewed:    The following studies were reviewed today: I discussed my findings with the patient at length.   Recent Labs: 05/11/2022: BUN 29; Creatinine, Ser 1.23; Potassium 5.1; Sodium 137  Recent Lipid Panel No results found for: "CHOL", "TRIG", "HDL", "CHOLHDL", "VLDL", "LDLCALC", "LDLDIRECT"  Physical Exam:    VS:  BP (!) 158/70   Pulse 84   Ht 5\' 8"  (1.727 m)   Wt 188 lb 6.4 oz (85.5 kg)   SpO2 95%   BMI 28.65 kg/m     Wt Readings from Last 3 Encounters:  08/18/22 188 lb 6.4 oz (85.5 kg)  05/11/22 193 lb 3.2 oz (87.6 kg)  04/22/22 205 lb (93 kg)     GEN: Patient is in no acute distress HEENT: Normal NECK: No JVD; No carotid bruits LYMPHATICS: No lymphadenopathy CARDIAC: Hear sounds regular, 2/6 systolic murmur at the apex. RESPIRATORY:  Clear to auscultation without rales, wheezing or rhonchi  ABDOMEN: Soft, non-tender, non-distended MUSCULOSKELETAL:  No  edema; No deformity  SKIN: Warm and dry NEUROLOGIC:  Alert and oriented x 3 PSYCHIATRIC:  Normal affect   Signed, Jenean Lindau, MD  08/18/2022 9:40 AM    [place]

## 2023-04-25 ENCOUNTER — Other Ambulatory Visit: Payer: Self-pay

## 2023-04-25 DIAGNOSIS — I480 Paroxysmal atrial fibrillation: Secondary | ICD-10-CM

## 2023-04-25 MED ORDER — METOPROLOL SUCCINATE ER 25 MG PO TB24: 12.5000 mg | ORAL_TABLET | Freq: Every day | ORAL | 0 refills | Status: DC

## 2023-04-25 MED ORDER — ELIQUIS 5 MG PO TABS: 5.0000 mg | ORAL_TABLET | Freq: Two times a day (BID) | ORAL | 1 refills | Status: DC

## 2023-04-25 NOTE — Telephone Encounter (Signed)
Prescription refill request for Eliquis received. Indication: PAF Last office visit: 08/18/22  R Revankar MD Scr: 1.45 on 01/12/23  Epic Age: 89 Weight: 85.5kg  Based on above findings Eliquis 5mg  twice daily is the appropriate dose.  Refill approved.

## 2023-05-22 NOTE — Progress Notes (Signed)
  Cardiology Office Note:  .   Date:  05/23/2023  ID:  Craig Rodriguez, DOB 01-Aug-1933, MRN [id number] PCP: Craig Eleanor Rung, NP   HeartCare Providers Cardiologist:  Craig Gable JONELLE Crape, MD    History of Present Illness: Craig   Craig Rodriguez is a 89 y.o. male with a past medical history of COPD on O2, HFpEF, HTN, PAF on Eliquis .   04/23/22 echo EF 55-60%, mild AS  Most recently evaluated by Dr. Crape on 05/11/22, he was stable from a cardiac perspective and maintaining SR.           He presents today accompanied by his daughter for follow-up of his atrial fibrillation.  He is stable from a cardiac perspective, offers no formal complaints.  He does have shortness of breath however this is baseline and unchanged.  He wears oxygen at night and sometimes during the day as needed.  He does have pedal edema, however this is also at his baseline and unchanged.  He denies hematochezia, hematuria, hemoptysis, falls, chest pain, palpitations, dyspnea, pnd, orthopnea, n, v, dizziness, syncope, weight gain, or early satiety.   ROS: Review of Systems  Respiratory:  Positive for shortness of breath.   Cardiovascular:  Positive for leg swelling.  All other systems reviewed and are negative.    Studies Reviewed: .            Risk Assessment/Calculations:    CHA2DS2-VASc Score = 4   This indicates a 4.8% annual risk of stroke. The patient's score is based upon: CHF History: 1 HTN History: 1 Diabetes History: 0 Stroke History: 0 Vascular Disease History: 0 Age Score: 2 Gender Score: 0     Physical Exam:   VS:  BP (!) 140/70 (BP Location: Left Arm, Patient Position: Sitting, Cuff Size: Small)   Pulse 86   Ht 5' 8 (1.727 m)   Wt 188 lb (85.3 kg)   SpO2 91%   BMI 28.59 kg/m    Wt Readings from Last 3 Encounters:  05/23/23 188 lb (85.3 kg)  08/18/22 188 lb 6.4 oz (85.5 kg)  05/11/22 193 lb 3.2 oz (87.6 kg)    GEN: Well nourished, well developed in no acute distress NECK: No  JVD; No carotid bruits CARDIAC: RRR, no murmurs, rubs, gallops RESPIRATORY: Diminished but clear ABDOMEN: Soft, non-tender, distended but at baseline EXTREMITIES:  trace pedal edema; No deformity   ASSESSMENT AND PLAN: .   PAF/hypercoagulable state -CHA2DS2-VASc score of 4, maintaining sinus rhythm, on Eliquis  5 mg twice daily-no indication for dose reduction, Craig repeat CBC and BMET today.  Continue metoprolol  12.5 mg daily.  Denies hematuria, hemoptysis, hematochezia, or falls.  COPD-currently on oxygen and at bedtime, also as needed during the day.  Breathing is at baseline, SpO2 91%.  Hypertension-blood pressure slightly elevated at 140/70, continue Toprol  12.5 mg daily.        Dispo: Advised to follow-up in 6 months however he stated he would follow-up in 9 months.CBC, BMET.   Signed, Craig JAYSON Hoover, NP

## 2023-05-23 ENCOUNTER — Ambulatory Visit: Payer: Medicare Other | Attending: Cardiology | Admitting: Cardiology

## 2023-05-23 VITALS — BP 140/70 | HR 86 | Ht 68.0 in | Wt 188.0 lb

## 2023-05-23 DIAGNOSIS — I10 Essential (primary) hypertension: Secondary | ICD-10-CM

## 2023-05-23 DIAGNOSIS — I480 Paroxysmal atrial fibrillation: Secondary | ICD-10-CM | POA: Diagnosis present

## 2023-05-23 DIAGNOSIS — D6859 Other primary thrombophilia: Secondary | ICD-10-CM

## 2023-05-23 NOTE — Patient Instructions (Signed)
 Medication Instructions:  Your physician recommends that you continue on your current medications as directed. Please refer to the Current Medication list given to you today.  *If you need a refill on your cardiac medications before your next appointment, please call your pharmacy*   Lab Work: Your physician recommends that you have labs done in the office today. Your test included  basic metabolic panel, complete blood count. If you have labs (blood work) drawn today and your tests are completely normal, you will receive your results only by: MyChart Message (if you have MyChart) OR A paper copy in the mail If you have any lab test that is abnormal or we need to change your treatment, we will call you to review the results.   Testing/Procedures: None Ordered   Follow-Up: At [facility], you and your health needs are our priority.  As part of our continuing mission to provide you with exceptional heart care, we have created designated Provider Care Teams.  These Care Teams include your primary Cardiologist (physician) and Advanced Practice Providers (APPs -  Physician Assistants and Nurse Practitioners) who all work together to provide you with the care you need, when you need it.  We recommend signing up for the patient portal called MyChart.  Sign up information is provided on this After Visit Summary.  MyChart is used to connect with patients for Virtual Visits (Telemedicine).  Patients are able to view lab/test results, encounter notes, upcoming appointments, etc.  Non-urgent messages can be sent to your provider as well.   To learn more about what you can do with MyChart, go to [email or web address].    Your next appointment:   6 month follow up with Dr. Revankar

## 2023-05-24 LAB — CBC
Hematocrit: 35.7 % — ABNORMAL LOW (ref 37.5–51.0)
Hemoglobin: 12 g/dL — ABNORMAL LOW (ref 13.0–17.7)
MCH: 34.8 pg — ABNORMAL HIGH (ref 26.6–33.0)
MCHC: 33.6 g/dL (ref 31.5–35.7)
MCV: 104 fL — ABNORMAL HIGH (ref 79–97)
Platelets: 230 x10E3/uL (ref 150–450)
RBC: 3.45 x10E6/uL — ABNORMAL LOW (ref 4.14–5.80)
RDW: 12.5 % (ref 11.6–15.4)
WBC: 6.5 x10E3/uL (ref 3.4–10.8)

## 2023-05-24 LAB — BASIC METABOLIC PANEL WITH GFR
BUN/Creatinine Ratio: 16 (ref 10–24)
BUN: 19 mg/dL (ref 8–27)
CO2: 29 mmol/L (ref 20–29)
Calcium: 9.1 mg/dL (ref 8.6–10.2)
Chloride: 97 mmol/L (ref 96–106)
Creatinine, Ser: 1.2 mg/dL (ref 0.76–1.27)
Glucose: 75 mg/dL (ref 70–99)
Potassium: 4.6 mmol/L (ref 3.5–5.2)
Sodium: 140 mmol/L (ref 134–144)
eGFR: 59 mL/min/1.73 — ABNORMAL LOW

## 2023-05-25 ENCOUNTER — Telehealth: Payer: Self-pay | Admitting: Emergency Medicine

## 2023-05-25 NOTE — Telephone Encounter (Signed)
 Results reviewed with daughter Aurther Loft per DPR, as per Wallis Bamberg NP's note.  She verbalized understanding and had no additional questions. Routed to PCP.

## 2023-05-25 NOTE — Telephone Encounter (Signed)
-----   Message from Flossie Dibble sent at 05/24/2023  7:51 AM EST ----- Stable labs, on correct dose of Eliquis. No changes.

## 2023-06-23 ENCOUNTER — Other Ambulatory Visit: Payer: Self-pay

## 2023-06-23 DIAGNOSIS — I10 Essential (primary) hypertension: Secondary | ICD-10-CM

## 2023-06-23 MED ORDER — POTASSIUM CHLORIDE CRYS ER 20 MEQ PO TBCR: 20.0000 meq | EXTENDED_RELEASE_TABLET | Freq: Every day | ORAL | 2 refills | Status: DC

## 2023-08-17 ENCOUNTER — Other Ambulatory Visit: Payer: Self-pay | Admitting: Cardiology

## 2023-08-17 DIAGNOSIS — I480 Paroxysmal atrial fibrillation: Secondary | ICD-10-CM

## 2023-11-14 ENCOUNTER — Other Ambulatory Visit: Payer: Self-pay | Admitting: Cardiology

## 2023-11-14 DIAGNOSIS — I480 Paroxysmal atrial fibrillation: Secondary | ICD-10-CM

## 2023-11-15 NOTE — Telephone Encounter (Signed)
 Prescription refill request for Eliquis  received. Indication:afib Last office visit:1/25 Scr:1.23  4/25 Age: 89 Weight:85.3  kg  Prescription refilled

## 2024-02-21 ENCOUNTER — Ambulatory Visit: Admitting: Cardiology

## 2024-02-23 ENCOUNTER — Encounter: Payer: Self-pay | Admitting: Cardiology

## 2024-02-23 ENCOUNTER — Ambulatory Visit: Attending: Cardiology | Admitting: Cardiology

## 2024-02-23 VITALS — BP 142/80 | HR 66 | Ht 68.0 in | Wt 180.6 lb

## 2024-02-23 DIAGNOSIS — I10 Essential (primary) hypertension: Secondary | ICD-10-CM | POA: Insufficient documentation

## 2024-02-23 DIAGNOSIS — Z9981 Dependence on supplemental oxygen: Secondary | ICD-10-CM | POA: Diagnosis present

## 2024-02-23 DIAGNOSIS — I480 Paroxysmal atrial fibrillation: Secondary | ICD-10-CM | POA: Insufficient documentation

## 2024-02-23 DIAGNOSIS — J431 Panlobular emphysema: Secondary | ICD-10-CM | POA: Diagnosis present

## 2024-02-23 DIAGNOSIS — E7800 Pure hypercholesterolemia, unspecified: Secondary | ICD-10-CM | POA: Diagnosis present

## 2024-02-23 NOTE — Progress Notes (Signed)
 Cardiology Office Note:    Date:  02/23/2024   ID:  Craig Rodriguez, DOB [birth year], MRN [id number]  PCP:  Benson Eleanor Rung, NP  Cardiologist:  Jennifer JONELLE Crape, MD   Referring MD: Benson Eleanor PARAS*    ASSESSMENT:    1. Essential hypertension   2. Paroxysmal A-fib (HCC)   3. Panlobular emphysema (HCC)   4. Supplemental oxygen dependent   5. Hypercholesterolemia    PLAN:    In order of problems listed above:  Primary prevention stressed with the patient.  Importance of compliance with diet medication stressed and patient verbalized standing. He was advised to ambulate to the best of his ability. Paroxysmal atrial fibrillation:I discussed with the patient atrial fibrillation, disease process. Management and therapy including rate and rhythm control, anticoagulation benefits and potential risks were discussed extensively with the patient. Patient had multiple questions which were answered to patient's satisfaction. Mixed dyslipidemia: On lipid-lowering medications followed by primary care.  Lipids were reviewed and discussed with the patient. Essential hypertension: Blood pressure stable and diet was emphasized.  Lifestyle modification urged. Patient Craig be seen in follow-up appointment in 6 months or earlier if the patient has any concerns.    Medication Adjustments/Labs and Tests Ordered: Current medicines are reviewed at length with the patient today.  Concerns regarding medicines are outlined above.  Orders Placed This Encounter  Procedures   EKG 12-Lead   No orders of the defined types were placed in this encounter.    No chief complaint on file.    History of Present Illness:    Craig Rodriguez is a 89 y.o. male.  Patient has past medical history of essential hypertension, mixed dyslipidemia, diabetes mellitus and paroxysmal atrial fibrillation on anticoagulation.  He ambulates with a cane.  He denies any chest pain orthopnea or PND.  He takes care of  activities of daily living.  At the time of my evaluation, the patient is alert awake oriented and in no distress.  His daughter accompanies him for this visit and is very supportive.  Past Medical History:  Diagnosis Date   Abnormal CT of the chest 09/04/2013   CT chest 08/2013:  RML collapse, with narrowing of RML bronchus.  No definite malignancy   Abnormal morphology of platelets 05/11/2016   Acute on chronic combined systolic and diastolic congestive heart failure (HCC) 05/04/2022   Adult spinal muscular atrophy (HCC) 11/13/2015   Benign prostatic hyperplasia without lower urinary tract symptoms 11/13/2015   Last Assessment & Plan: Formatting of this note might be different from the original. This is stable for him at this time and Craig follow   Cardiac murmur 04/19/2022   COPD (chronic obstructive pulmonary disease) (HCC)    COPD with emphysema (HCC) 08/31/2013   PFTs 2015:  FEV1 1.30 (45%), ratio 50, 22% increase in FEV1 with BD, +airtrapping, DLCO 52% pred  Refused anticholinergic treatment  Refused pulmonary rehab.    Dyspnea on exertion 04/19/2022   Elevated prostate specific antigen (PSA) 11/13/2015   Encounter for long-term (current) use of high-risk medication 11/13/2015   Enlarged prostate    Essential hypertension 11/13/2015   Last Assessment & Plan: Formatting of this note might be different from the original. On repeat this is stable for him and he is taking his lasix as directed   Hypercholesterolemia 11/13/2015   Last Assessment & Plan: Formatting of this note might be different from the original. Update his lipids for him today   Localized edema 11/13/2015   Last  Assessment & Plan: Formatting of this note might be different from the original. Stable with the use of the lasix   Malaise and fatigue 11/13/2015   Last Assessment & Plan: Formatting of this note might be different from the original. Up and down for him with there being more of it when he has exerted himself  which he tries to avoid   Mild chronic anemia 05/04/2022   Noncompliance 11/04/2020   Oxygen dependent 11/13/2015   Last Assessment & Plan: Formatting of this note might be different from the original. Wears this at night at 2 liters and rarely through the day if he needs it he Craig   Paroxysmal A-fib (HCC) 05/04/2022   Peripheral vascular disease of lower extremity 10/16/2019   Prediabetes 02/27/2019   Rash 11/16/2017   Refusal of statin medication by patient 11/04/2020   Senile nuclear sclerosis 03/16/2021   Stage 3a chronic kidney disease (HCC) 05/04/2022   Supplemental oxygen dependent 11/13/2015   Last Assessment & Plan: Formatting of this note might be different from the original. Wears this at night at 2 liters and rarely through the day if he needs it he Craig      Past Surgical History:  Procedure Laterality Date   VIDEO BRONCHOSCOPY Bilateral 11/06/2013   Procedure: VIDEO BRONCHOSCOPY WITHOUT FLUORO;  Surgeon: Francis CHRISTELLA Dresser, MD;  Location: [facility];  Service: Cardiopulmonary;  Laterality: Bilateral;    Current Medications: Current Meds  Medication Sig   ELIQUIS  5 MG TABS tablet Take 1 tablet (5 mg total) by mouth 2 (two) times daily.   finasteride (PROSCAR) 5 MG tablet Take 5 mg by mouth daily.   fluticasone (FLONASE) 50 MCG/ACT nasal spray Place 2 sprays into both nostrils daily.   furosemide (LASIX) 40 MG tablet Take 40 mg by mouth every morning.   metoprolol  succinate (TOPROL -XL) 25 MG 24 hr tablet Take 0.5 tablets (12.5 mg total) by mouth daily.   potassium chloride  SA (KLOR-CON  M) 20 MEQ tablet Take 1 tablet (20 mEq total) by mouth daily.   tamsulosin (FLOMAX) 0.4 MG CAPS capsule Take 1 tablet by mouth daily.   TRELEGY ELLIPTA 200-62.5-25 MCG/ACT AEPB Inhale 1 puff into the lungs daily.   triamcinolone cream (KENALOG) 0.1 % Apply 1 Application topically 2 (two) times daily.   [DISCONTINUED] folic acid (FOLVITE) 1 MG tablet Take 1 tablet by mouth daily.      Allergies:   Patient has no known allergies.   Social History   Socioeconomic History   Marital status: Widowed    Spouse name: Not on file   Number of children: Not on file   Years of education: Not on file   Highest education level: Not on file  Occupational History   Occupation: retired  Tobacco Use   Smoking status: Former    Current packs/day: 0.00    Average packs/day: 1 pack/day for 60.0 years (60.0 ttl pk-yrs)    Types: Cigarettes    Start date: 05/19/1951    Quit date: 05/19/2013    Years since quitting: 10.7   Smokeless tobacco: Never   Tobacco comments:    h/o 2 PPD in past  Substance and Sexual Activity   Alcohol use: Yes    Comment: 1 drink once a day   Drug use: No   Sexual activity: Not on file  Other Topics Concern   Not on file  Social History Narrative   Not on file   Social Drivers of Corporate investment banker  Strain: Not on file  Food Insecurity: Low Risk  (08/17/2023)   Received from [facility]   Hunger Vital Sign    Within the past 12 months, you worried that your food would run out before you got money to buy more: Never true    Within the past 12 months, the food you bought just didn't last and you didn't have money to get more. : Never true  Transportation Needs: No Transportation Needs (08/17/2023)   Received from [facility]    In the past 12 months, has lack of reliable transportation kept you from medical appointments, meetings, work or from getting things needed for daily living? : No  Physical Activity: Not on file  Stress: No Stress Concern Present (06/23/2021)   Received from [facility] - Occupational Stress Questionnaire    Feeling of Stress : Not at all  Social Connections: Unknown (09/29/2021)   Received from [facility]   Social Network    Social Network: Not on file     Family History: The patient's family history includes Cancer in his mother; Diabetes in  his sister and sister; Kidney failure in his father.  ROS:   Please see the history of present illness.    All other systems reviewed and are negative.  EKGs/Labs/Other Studies Reviewed:    The following studies were reviewed today: .SABRAEKG Interpretation Date/Time:  Thursday February 23 2024 10:24:11 EDT Ventricular Rate:  66 PR Interval:  148 QRS Duration:  122 QT Interval:  396 QTC Calculation: 415 R Axis:   -41  Text Interpretation: Sinus rhythm with marked sinus arrhythmia Left axis deviation Right bundle branch block Inferior infarct , age undetermined Abnormal ECG No previous ECGs available Confirmed by Edwyna Backers [phone]) on 02/23/2024 10:32:23 AM     Recent Labs: 05/23/2023: BUN 19; Creatinine, Ser 1.20; Hemoglobin 12.0; Platelets 230; Potassium 4.6; Sodium 140  Recent Lipid Panel No results found for: CHOL, TRIG, HDL, CHOLHDL, VLDL, LDLCALC, LDLDIRECT  Physical Exam:    VS:  BP (!) 142/80   Pulse 66   Ht 5' 8 (1.727 m)   Wt 180 lb 9.6 oz (81.9 kg)   SpO2 94%   BMI 27.46 kg/m     Wt Readings from Last 3 Encounters:  02/23/24 180 lb 9.6 oz (81.9 kg)  05/23/23 188 lb (85.3 kg)  08/18/22 188 lb 6.4 oz (85.5 kg)     GEN: Patient is in no acute distress HEENT: Normal NECK: No JVD; No carotid bruits LYMPHATICS: No lymphadenopathy CARDIAC: Hear sounds regular, 2/6 systolic murmur at the apex. RESPIRATORY:  Clear to auscultation without rales, wheezing or rhonchi  ABDOMEN: Soft, non-tender, non-distended MUSCULOSKELETAL:  No edema; No deformity  SKIN: Warm and dry NEUROLOGIC:  Alert and oriented x 3 PSYCHIATRIC:  Normal affect   Signed, Backers JONELLE Edwyna, MD  02/23/2024 10:33 AM    [facility]

## 2024-02-23 NOTE — Patient Instructions (Signed)

## 2024-02-24 ENCOUNTER — Other Ambulatory Visit (HOSPITAL_COMMUNITY): Payer: Self-pay

## 2024-03-29 ENCOUNTER — Other Ambulatory Visit: Payer: Self-pay | Admitting: Cardiology

## 2024-03-29 DIAGNOSIS — I10 Essential (primary) hypertension: Secondary | ICD-10-CM

## 2024-05-02 ENCOUNTER — Other Ambulatory Visit: Payer: Self-pay | Admitting: Cardiology

## 2024-05-02 DIAGNOSIS — I480 Paroxysmal atrial fibrillation: Secondary | ICD-10-CM

## 2024-05-28 ENCOUNTER — Other Ambulatory Visit: Payer: Self-pay | Admitting: Cardiology

## 2024-05-28 DIAGNOSIS — I480 Paroxysmal atrial fibrillation: Secondary | ICD-10-CM
